# Patient Record
Sex: Female | Born: 1990 | Race: Black or African American | Hispanic: No | Marital: Married | Smoking: Never smoker
Health system: Southern US, Community
[De-identification: ages and names within clinical notes are randomized; demographics above are authoritative.]

## PROBLEM LIST (undated history)

## (undated) DIAGNOSIS — Z789 Other specified health status: Secondary | ICD-10-CM

## (undated) HISTORY — PX: NO PAST SURGERIES: SHX2092

---

## 2019-03-06 ENCOUNTER — Emergency Department (HOSPITAL_COMMUNITY)
Admission: EM | Admit: 2019-03-06 | Discharge: 2019-03-06 | Disposition: A | Discharge status: Home / Self Care | Payer: BLUE CROSS/BLUE SHIELD | Attending: Emergency Medicine | Admitting: Emergency Medicine

## 2019-03-06 ENCOUNTER — Other Ambulatory Visit: Payer: Self-pay

## 2019-03-06 ENCOUNTER — Encounter (HOSPITAL_COMMUNITY): Payer: Self-pay

## 2019-03-06 DIAGNOSIS — I10 Essential (primary) hypertension: Secondary | ICD-10-CM | POA: Diagnosis not present

## 2019-03-06 DIAGNOSIS — R2243 Localized swelling, mass and lump, lower limb, bilateral: Secondary | ICD-10-CM | POA: Insufficient documentation

## 2019-03-06 DIAGNOSIS — M7989 Other specified soft tissue disorders: Secondary | ICD-10-CM

## 2019-03-06 NOTE — ED Provider Notes (Signed)
[facility]-EMERGENCY DEPT Provider Note   CSN: [id number] Arrival date & time: 03/06/19  2005    History   Chief Complaint Chief Complaint  Patient presents with  . Foot Swelling    HPI Paula Cross is a 28 y.o. female.     The history is provided by the patient.  Hypertension  This is a new problem. Episode frequency: unknown, sent from urgent care for high blood pressure, trace swelling in bilaterally feet. The problem has not changed since onset.Pertinent negatives include no chest pain, no abdominal pain, no headaches and no shortness of breath. Nothing aggravates the symptoms. The symptoms are relieved by rest (elevating legs). She has tried nothing for the symptoms. The treatment provided no relief.    History reviewed. No pertinent past medical history.  There are no active problems to display for this patient.   History reviewed. No pertinent surgical history.   OB History   No obstetric history on file.      Home Medications    Prior to Admission medications   Not on File    Family History History reviewed. No pertinent family history.  Social History Social History   Tobacco Use  . Smoking status: Never Smoker  . Smokeless tobacco: Never Used  Substance Use Topics  . Alcohol use: Never    Frequency: Never  . Drug use: Never     Allergies   Patient has no allergy information on record.   Review of Systems Review of Systems  Constitutional: Negative for chills and fever.  HENT: Negative for ear pain and sore throat.   Eyes: Negative for pain and visual disturbance.  Respiratory: Negative for cough and shortness of breath.   Cardiovascular: Positive for leg swelling (bilateral feet over the last several days). Negative for chest pain and palpitations.  Gastrointestinal: Negative for abdominal pain and vomiting.  Genitourinary: Negative for dysuria and hematuria.  Musculoskeletal: Negative for arthralgias and  back pain.  Skin: Negative for color change and rash.  Neurological: Negative for seizures, syncope and headaches.  All other systems reviewed and are negative.    Physical Exam Updated Vital Signs BP (!) 147/99 (BP Location: Left Arm)   Pulse 86   Temp 97.8 F (36.6 C) (Oral)   Resp 16   LMP 02/23/2019   SpO2 100%   Physical Exam Vitals signs and nursing note reviewed.  Constitutional:      General: She is not in acute distress.    Appearance: She is well-developed.  HENT:     Head: Normocephalic and atraumatic.     Nose: Nose normal.     Mouth/Throat:     Mouth: Mucous membranes are moist.  Eyes:     Extraocular Movements: Extraocular movements intact.     Conjunctiva/sclera: Conjunctivae normal.     Pupils: Pupils are equal, round, and reactive to light.  Neck:     Musculoskeletal: Neck supple.  Cardiovascular:     Rate and Rhythm: Normal rate and regular rhythm.     Pulses: Normal pulses.     Heart sounds: Normal heart sounds. No murmur.  Pulmonary:     Effort: Pulmonary effort is normal. No respiratory distress.     Breath sounds: Normal breath sounds.  Abdominal:     Palpations: Abdomen is soft.     Tenderness: There is no abdominal tenderness.  Musculoskeletal:     Right lower leg: Edema (trace in foot) present.     Left lower leg:  Edema (trace in foot) present.  Skin:    General: Skin is warm and dry.  Neurological:     Mental Status: She is alert.      ED Treatments / Results  Labs (all labs ordered are listed, but only abnormal results are displayed) Labs Reviewed - No data to display  EKG None  Radiology No results found.  Procedures Procedures (including critical care time)  Medications Ordered in ED Medications - No data to display   Initial Impression / Assessment and Plan / ED Course  I have reviewed the triage vital signs and the nursing notes.  Pertinent labs & imaging results that were available during my care of the patient  were reviewed by me and considered in my medical decision making (see chart for details).     Paula Cross is a 28 year old female no significant medical history presents the ED with hypertension.  Patient mildly hypertensive to 147/99.  Otherwise normal vitals.  Patient denies any chest pain, shortness of breath.  Patient was sent from urgent care.  She has had some trace swelling in her feet over the last several days intermittently.  This has occurred in the past.  Patient recently had her menstrual cycle.  No concern for pregnancy.  Does not have any symptoms concerning for ACS or stroke.  She has no calf tenderness or swelling above her ankles.  Suspect likely mild venous stasis.  No history of hypertension.  States that she has been having a lot of salty foods recently.  She states that when she wakes up in the morning her feet are not swollen.  She states that she stands a lot for work and notices that the swelling is worse at the end of work.  Overall gave patient reassurance.  Recommend that she keep a journal of her blood pressures over the next several weeks and follow-up with her primary care doctor as she may need to be started on a blood pressure medication.  Recommend that she wear compression stockings at work.  No concern for heart failure or other acute emergency. No concern for DVT.  Given education and discharged from ED in good condition.  Given return precautions.  This chart was dictated using voice recognition software.  Despite best efforts to proofread,  errors can occur which can change the documentation meaning.    Final Clinical Impressions(s) / ED Diagnoses   Final diagnoses:  Foot swelling  Hypertension, unspecified type    ED Discharge Orders    None       Virgina NorfolkCuratolo, Iyanla Eilers, DO 03/06/19 2048

## 2019-03-06 NOTE — ED Triage Notes (Signed)
Pt complains of bilateral feet swelling, she was working outside today in the heat and her shoes were tight, she also states that the [facility] said her blood pressure was elevated and suggested she come here

## 2019-03-06 NOTE — ED Notes (Signed)
Pt was given d/c instructions by provider and was walked out by them prior to me discharging and obtaining a signiture. Pt ambulated out of department in no acute distress.

## 2019-07-19 ENCOUNTER — Encounter (HOSPITAL_COMMUNITY): Payer: Self-pay | Admitting: Emergency Medicine

## 2019-07-19 ENCOUNTER — Other Ambulatory Visit: Payer: Self-pay

## 2019-07-19 ENCOUNTER — Ambulatory Visit (HOSPITAL_COMMUNITY)
Admission: EM | Admit: 2019-07-19 | Discharge: 2019-07-19 | Disposition: A | Discharge status: Home / Self Care | Payer: BC Managed Care – PPO | Attending: Family Medicine | Admitting: Family Medicine

## 2019-07-19 DIAGNOSIS — Z0189 Encounter for other specified special examinations: Secondary | ICD-10-CM | POA: Diagnosis present

## 2019-07-19 DIAGNOSIS — Z3201 Encounter for pregnancy test, result positive: Secondary | ICD-10-CM | POA: Diagnosis present

## 2019-07-19 LAB — HCG, QUANTITATIVE, PREGNANCY: hCG, Beta Chain, Quant, S: 908 m[IU]/mL — ABNORMAL HIGH (ref <5)

## 2019-07-19 NOTE — ED Triage Notes (Signed)
Pt states shes been to her OB for fertility issues a few months ago, stated her progesterone was low, told her to lose 15 pounds, pt lost the weight and took a pregnancy test yesgterday and today, both were positive. Wants confirmation that she is indeed pregnant.

## 2019-07-19 NOTE — ED Provider Notes (Signed)
[place]    CSN: [id number] Arrival date & time: 07/19/19  1114      History   Chief Complaint Chief Complaint  Patient presents with  . Possible Pregnancy    HPI Paula Cross is a 28 y.o. female.   Patient presents with request for pregnancy test.  She has taken 3 at home urine pregnancy test which were all positive.  She request confirmation of this because she has been experiencing infertility and was told that her progesterone level was low.  Patient denies abdominal pain, pelvic pain, back pain, dysuria, vaginal discharge, or other concerns.  LMP: 06/18/2019.    The history is provided by the patient.    History reviewed. No pertinent past medical history.  There are no active problems to display for this patient.   History reviewed. No pertinent surgical history.  OB History   No obstetric history on file.      Home Medications    Prior to Admission medications   Not on File    Family History No family history on file.  Social History Social History   Tobacco Use  . Smoking status: Never Smoker  . Smokeless tobacco: Never Used  Substance Use Topics  . Alcohol use: Never    Frequency: Never  . Drug use: Never     Allergies   Patient has no known allergies.   Review of Systems Review of Systems  Constitutional: Negative for chills and fever.  HENT: Negative for ear pain and sore throat.   Eyes: Negative for pain and visual disturbance.  Respiratory: Negative for cough and shortness of breath.   Cardiovascular: Negative for chest pain and palpitations.  Gastrointestinal: Negative for abdominal pain and vomiting.  Genitourinary: Negative for dysuria, flank pain, hematuria, pelvic pain, vaginal bleeding and vaginal discharge.  Musculoskeletal: Negative for arthralgias and back pain.  Skin: Negative for color change and rash.  Neurological: Negative for seizures and syncope.  All other systems reviewed and are negative.     Physical Exam Triage Vital Signs ED Triage Vitals  Enc Vitals Group     BP      Pulse      Resp      Temp      Temp src      SpO2      Weight      Height      Head Circumference      Peak Flow      Pain Score      Pain Loc      Pain Edu?      Excl. in [place]?    No data found.  Updated Vital Signs BP 127/65   Pulse (!) 110   Temp 97.7 F (36.5 C)   Resp 18   LMP 06/18/2019   SpO2 100%   Visual Acuity Right Eye Distance:   Left Eye Distance:   Bilateral Distance:    Right Eye Near:   Left Eye Near:    Bilateral Near:     Physical Exam Vitals signs and nursing note reviewed.  Constitutional:      General: She is not in acute distress.    Appearance: She is well-developed.  HENT:     Head: Normocephalic and atraumatic.     Mouth/Throat:     Mouth: Mucous membranes are moist.  Eyes:     Conjunctiva/sclera: Conjunctivae normal.  Neck:     Musculoskeletal: Neck supple.  Cardiovascular:  Rate and Rhythm: Normal rate and regular rhythm.     Heart sounds: No murmur.  Pulmonary:     Effort: Pulmonary effort is normal. No respiratory distress.     Breath sounds: Normal breath sounds.  Abdominal:     General: Bowel sounds are normal.     Palpations: Abdomen is soft.     Tenderness: There is no abdominal tenderness. There is no right CVA tenderness, left CVA tenderness, guarding or rebound.  Skin:    General: Skin is warm and dry.     Findings: No rash.  Neurological:     Mental Status: She is alert and oriented to person, place, and time.  Psychiatric:        Mood and Affect: Mood normal.        Behavior: Behavior normal.      UC Treatments / Results  Labs (all labs ordered are listed, but only abnormal results are displayed) Labs Reviewed  HCG, QUANTITATIVE, PREGNANCY - Abnormal; Notable for the following components:      Result Value   hCG, Beta Chain, Quant, S 908 (*)    All other components within normal limits    EKG   Radiology No  results found.  Procedures Procedures (including critical care time)  Medications Ordered in UC Medications - No data to display  Initial Impression / Assessment and Plan / UC Course  I have reviewed the triage vital signs and the nursing notes.  Pertinent labs & imaging results that were available during my care of the patient were reviewed by me and considered in my medical decision making (see chart for details).    Patient request for pregnancy test.  Quantitative hCG positive.   Instructed patient to start taking prenatal vitamins today, increase her water intake, and follow-up with her OB/GYN next week.  Patient agrees to plan of care.     Final Clinical Impressions(s) / UC Diagnoses   Final diagnoses:  Patient request for diagnostic testing  Positive pregnancy test     Discharge Instructions     Start taking pre-natal vitamins today.    Make sure you are staying hydrated with 10-12 glasses of water daily.  Follow up with your OB/GYN next week.        ED Prescriptions    None     PDMP not reviewed this encounter.   Mickie Bail, NP 07/19/19 1430

## 2019-07-19 NOTE — Discharge Instructions (Addendum)
Start taking pre-natal vitamins today.    Make sure you are staying hydrated with 10-12 glasses of water daily.  Follow up with your OB/GYN next week.

## 2019-07-28 ENCOUNTER — Other Ambulatory Visit: Payer: Self-pay | Admitting: Obstetrics

## 2019-08-20 ENCOUNTER — Encounter (HOSPITAL_COMMUNITY): Payer: Self-pay

## 2019-08-20 ENCOUNTER — Other Ambulatory Visit: Payer: Self-pay

## 2019-08-20 ENCOUNTER — Emergency Department (HOSPITAL_COMMUNITY)
Admission: EM | Admit: 2019-08-20 | Discharge: 2019-08-21 | Disposition: A | Discharge status: Home / Self Care | Payer: BC Managed Care – PPO | Attending: Emergency Medicine | Admitting: Emergency Medicine

## 2019-08-20 DIAGNOSIS — Z79899 Other long term (current) drug therapy: Secondary | ICD-10-CM | POA: Diagnosis not present

## 2019-08-20 DIAGNOSIS — O418X12 Other specified disorders of amniotic fluid and membranes, first trimester, fetus 2: Secondary | ICD-10-CM | POA: Insufficient documentation

## 2019-08-20 DIAGNOSIS — O209 Hemorrhage in early pregnancy, unspecified: Secondary | ICD-10-CM | POA: Diagnosis present

## 2019-08-20 DIAGNOSIS — O468X1 Other antepartum hemorrhage, first trimester: Secondary | ICD-10-CM

## 2019-08-20 DIAGNOSIS — Z3A09 9 weeks gestation of pregnancy: Secondary | ICD-10-CM | POA: Insufficient documentation

## 2019-08-20 DIAGNOSIS — O418X10 Other specified disorders of amniotic fluid and membranes, first trimester, not applicable or unspecified: Secondary | ICD-10-CM

## 2019-08-20 LAB — CBC WITH DIFFERENTIAL/PLATELET
Abs Immature Granulocytes: 0.03 10*3/uL (ref 0.00–0.07)
Basophils Absolute: 0 10*3/uL (ref 0.0–0.1)
Basophils Relative: 0 %
Eosinophils Absolute: 0.1 10*3/uL (ref 0.0–0.5)
Eosinophils Relative: 1 %
HCT: 40.9 % (ref 36.0–46.0)
Hemoglobin: 13.2 g/dL (ref 12.0–15.0)
Immature Granulocytes: 0 %
Lymphocytes Relative: 16 %
Lymphs Abs: 1.7 10*3/uL (ref 0.7–4.0)
MCH: 27.6 pg (ref 26.0–34.0)
MCHC: 32.3 g/dL (ref 30.0–36.0)
MCV: 85.4 fL (ref 80.0–100.0)
Monocytes Absolute: 0.9 10*3/uL (ref 0.1–1.0)
Monocytes Relative: 9 %
Neutro Abs: 8 10*3/uL — ABNORMAL HIGH (ref 1.7–7.7)
Neutrophils Relative %: 74 %
Platelets: 278 10*3/uL (ref 150–400)
RBC: 4.79 MIL/uL (ref 3.87–5.11)
RDW: 15.4 % (ref 11.5–15.5)
WBC: 10.8 10*3/uL — ABNORMAL HIGH (ref 4.0–10.5)
nRBC: 0 % (ref 0.0–0.2)

## 2019-08-20 LAB — BASIC METABOLIC PANEL
Anion gap: 10 (ref 5–15)
BUN: 12 mg/dL (ref 6–20)
CO2: 19 mmol/L — ABNORMAL LOW (ref 22–32)
Calcium: 9.2 mg/dL (ref 8.9–10.3)
Chloride: 108 mmol/L (ref 98–111)
Creatinine, Ser: 0.85 mg/dL (ref 0.44–1.00)
GFR calc Af Amer: >60 mL/min (ref >60)
GFR calc non Af Amer: >60 mL/min (ref >60)
Glucose, Bld: 95 mg/dL (ref 70–99)
Potassium: 3.8 mmol/L (ref 3.5–5.1)
Sodium: 137 mmol/L (ref 135–145)

## 2019-08-20 LAB — WET PREP, GENITAL
Clue Cells Wet Prep HPF POC: NONE SEEN
Sperm: NONE SEEN
Trich, Wet Prep: NONE SEEN
Yeast Wet Prep HPF POC: NONE SEEN

## 2019-08-20 LAB — HCG, QUANTITATIVE, PREGNANCY: hCG, Beta Chain, Quant, S: 167422 m[IU]/mL — ABNORMAL HIGH (ref <5)

## 2019-08-20 LAB — ABO/RH: ABO/RH(D): B POS

## 2019-08-20 NOTE — ED Notes (Signed)
Patient stated that this is her first time being pregnant, she had two episodes of light pink discharge today, the bleeding was so faint that she only saw it when she wiped. She had an episode of yellow discharge earlier this week and brown discharge last week, which she called her OB about. She states this is her first pregnancy and she is being very cautious.

## 2019-08-20 NOTE — ED Triage Notes (Signed)
Pt coming from home c/o vaginal bleeding that started 20 minutes ago with moderate amount of blood after wiping. Pt is [redacted] weeks pregnant with twins. Last sexual intercourse was 2 weeks ago. No cramping.

## 2019-08-20 NOTE — ED Provider Notes (Signed)
[facility]-EMERGENCY DEPT Provider Note   CSN: [id number] Arrival date & time: 08/20/19  2137     History   Chief Complaint Chief Complaint  Patient presents with   Vaginal Bleeding    pregnant     HPI Paula Cross is a 28 y.o. female.     28 year old female presents today with her husband for complaint of vaginal bleeding and pregnancy.  Patient states that she is [redacted] weeks pregnant with twins, confirmed on ultrasound at her OB office, Dr. Algie CofferFogelman.  Patient states she went to the bathroom tonight and noticed a small amount of light pink blood on the toilet paper, when a second time and noticed blood mixed with her yellow vaginal discharge.  Patient denies any lower pelvic pain, cramping, nausea, vomiting, changes in bowel or bladder habits. G1P0, no other complaints or concerns.      History reviewed. No pertinent past medical history.  There are no active problems to display for this patient.   History reviewed. No pertinent surgical history.   OB History    Gravida  1   Para      Term      Preterm      AB      Living        SAB      TAB      Ectopic      Multiple      Live Births               Home Medications    Prior to Admission medications   Medication Sig Start Date End Date Taking? Authorizing Provider  Prenatal Vit-Fe Fumarate-FA (PRENATAL MULTIVITAMIN) TABS tablet Take 1 tablet by mouth daily at 12 noon.   Yes [provider]    Family History No family history on file.  Social History Social History   Tobacco Use   Smoking status: Never Smoker   Smokeless tobacco: Never Used  Substance Use Topics   Alcohol use: Never    Frequency: Never   Drug use: Never     Allergies   Patient has no known allergies.   Review of Systems Review of Systems  Constitutional: Negative for fever.  Respiratory: Negative for shortness of breath.   Cardiovascular: Negative for chest pain.    Gastrointestinal: Negative for abdominal pain, constipation, diarrhea, nausea and vomiting.  Genitourinary: Positive for vaginal bleeding and vaginal discharge. Negative for dysuria and frequency.  Musculoskeletal: Negative for back pain.  Skin: Negative for rash and wound.  Allergic/Immunologic: Negative for immunocompromised state.  Neurological: Negative for dizziness and weakness.  Hematological: Does not bruise/bleed easily.  Psychiatric/Behavioral: Negative for confusion.  All other systems reviewed and are negative.    Physical Exam Updated Vital Signs BP (!) 125/91 (BP Location: Left Arm)    Pulse 78    Temp 98.1 F (36.7 C) (Oral)    Resp 15    Ht 5\' 11"  (1.803 m)    LMP 06/18/2019    SpO2 99%   Physical Exam Vitals signs and nursing note reviewed. Exam conducted with a chaperone present.  Constitutional:      General: She is not in acute distress.    Appearance: She is well-developed. She is not diaphoretic.  HENT:     Head: Normocephalic and atraumatic.  Cardiovascular:     Rate and Rhythm: Normal rate and regular rhythm.     Pulses: Normal pulses.     Heart sounds: Normal heart  sounds.  Pulmonary:     Effort: Pulmonary effort is normal.     Breath sounds: Normal breath sounds.  Abdominal:     General: There is no distension.     Palpations: Abdomen is soft.     Tenderness: There is no abdominal tenderness.  Genitourinary:    Cervix: No cervical motion tenderness.     Uterus: Not tender.      Adnexa:        Right: No tenderness.         Left: No tenderness.       Comments: Exam limited by BMP, no pain on exam. Small amount of brown discharge present.  Skin:    General: Skin is warm and dry.     Findings: No erythema or rash.  Neurological:     Mental Status: She is alert and oriented to person, place, and time.  Psychiatric:        Behavior: Behavior normal.      ED Treatments / Results  Labs (all labs ordered are listed, but only abnormal results  are displayed) Labs Reviewed  WET PREP, GENITAL - Abnormal; Notable for the following components:      Result Value   WBC, Wet Prep HPF POC FEW (*)    All other components within normal limits  HCG, QUANTITATIVE, PREGNANCY - Abnormal; Notable for the following components:   hCG, Beta Neomia Dear 517,616 (*)    All other components within normal limits  BASIC METABOLIC PANEL - Abnormal; Notable for the following components:   CO2 19 (*)    All other components within normal limits  CBC WITH DIFFERENTIAL/PLATELET - Abnormal; Notable for the following components:   WBC 10.8 (*)    Neutro Abs 8.0 (*)    All other components within normal limits  HIV ANTIBODY (ROUTINE TESTING W REFLEX)  ABO/RH  GC/CHLAMYDIA PROBE AMP ([place]) NOT AT [facility]    EKG None  Radiology US Ob Comp < 14 Wks  Result Date: 08/21/2019 CLINICAL DATA:  Vaginal bleeding with positive pregnancy test EXAM: TWIN OBSTETRIC <14WK [place] AND TRANSVAGINAL OB US COMPARISON:  None. FINDINGS: Number of IUPs:  2 TWIN 1 Yolk sac:  Present Embryo:  Present Cardiac Activity: Present Heart Rate: 156 bpm CRL:  22.9 mm   9 w 0 d TWIN 2 Yolk sac:  Present Embryo:  Present Cardiac Activity: Present Heart Rate: 149 bpm CRL:  21 mm   8 w 5 d Subchorionic hemorrhage:  Small subchorionic hemorrhage is noted. Maternal uterus/adnexae: The ovaries are not well visualized. Mild fluid is noted within the cervical canal. IMPRESSION: Twin live intrauterine gestations. Small subchorionic hemorrhage is noted. No other focal abnormality is seen. Electronically Signed   By: Inez Catalina M.D.   On: 08/21/2019 01:11   US Ob Transvaginal  Result Date: 08/21/2019 CLINICAL DATA:  Vaginal bleeding with positive pregnancy test EXAM: TWIN OBSTETRIC <14WK [place] AND TRANSVAGINAL OB US COMPARISON:  None. FINDINGS: Number of IUPs:  2 TWIN 1 Yolk sac:  Present Embryo:  Present Cardiac Activity: Present Heart Rate: 156 bpm CRL:  22.9 mm   9 w 0 d TWIN 2 Yolk sac:   Present Embryo:  Present Cardiac Activity: Present Heart Rate: 149 bpm CRL:  21 mm   8 w 5 d Subchorionic hemorrhage:  Small subchorionic hemorrhage is noted. Maternal uterus/adnexae: The ovaries are not well visualized. Mild fluid is noted within the cervical canal. IMPRESSION: Twin live intrauterine gestations. Small subchorionic  hemorrhage is noted. No other focal abnormality is seen. Electronically Signed   By: Alcide Clever M.D.   On: 08/21/2019 01:11    Procedures Procedures (including critical care time)  Medications Ordered in ED Medications - No data to display   Initial Impression / Assessment and Plan / ED Course  I have reviewed the triage vital signs and the nursing notes.  Pertinent labs & imaging results that were available during my care of the patient were reviewed by me and considered in my medical decision making (see chart for details).  Clinical Course as of Aug 20 257  Thu Aug 21, 2019  7268 28 year old female presents with complaint of vaginal bleeding, states she is [redacted] weeks pregnant as confirmed by ultrasound at her OB office, had noticed blood with wiping today.  Patient is not having to wear tampons or pads, denies any pelvic cramping or pain.  On exam patient has a small amount of brown discharge present, no pelvic tenderness.  Ultrasound shows a small subchorionic hemorrhage, viable twin pregnancy.  CBC, BMP without significant findings, hCG consistent with pregnancy, wet prep negative, patient is B+ and does not require RhoGam.  Patient to follow-up with her OB, return to the ER or go to [facility] if symptoms are concerning.   [LM]    Clinical Course User Index [LM] Jeannie Fend, PA-C      Final Clinical Impressions(s) / ED Diagnoses   Final diagnoses:  Subchorionic hematoma in first trimester, single or unspecified fetus    ED Discharge Orders    None       Jeannie Fend, PA-C 08/21/19 0259    Mancel Bale, MD 08/21/19 1150

## 2019-08-21 ENCOUNTER — Emergency Department (HOSPITAL_COMMUNITY): Payer: BC Managed Care – PPO

## 2019-08-21 LAB — HIV ANTIBODY (ROUTINE TESTING W REFLEX): HIV Screen 4th Generation wRfx: NONREACTIVE

## 2019-08-21 NOTE — Discharge Instructions (Addendum)
Follow-up with your OB.  Return to the ER or present to [facility] women's unit for any worsening or concerning symptoms.

## 2019-08-22 LAB — GC/CHLAMYDIA PROBE AMP (~~LOC~~) NOT AT ARMC
Chlamydia: NEGATIVE
Neisseria Gonorrhea: NEGATIVE

## 2019-08-28 LAB — OB RESULTS CONSOLE RPR: RPR: NONREACTIVE

## 2019-08-28 LAB — OB RESULTS CONSOLE RUBELLA ANTIBODY, IGM: Rubella: IMMUNE

## 2019-08-28 LAB — OB RESULTS CONSOLE HEPATITIS B SURFACE ANTIGEN: Hepatitis B Surface Ag: NEGATIVE

## 2019-11-26 ENCOUNTER — Inpatient Hospital Stay (HOSPITAL_COMMUNITY)
Admission: AD | Admit: 2019-11-26 | Discharge: 2019-12-31 | DRG: 833 | Disposition: A | Discharge status: Home / Self Care | Payer: BC Managed Care – PPO | Attending: Obstetrics | Admitting: Obstetrics

## 2019-11-26 DIAGNOSIS — Z3A25 25 weeks gestation of pregnancy: Secondary | ICD-10-CM | POA: Diagnosis not present

## 2019-11-26 DIAGNOSIS — O3432 Maternal care for cervical incompetence, second trimester: Secondary | ICD-10-CM | POA: Diagnosis present

## 2019-11-26 DIAGNOSIS — Z20822 Contact with and (suspected) exposure to covid-19: Secondary | ICD-10-CM | POA: Diagnosis present

## 2019-11-26 DIAGNOSIS — O3430 Maternal care for cervical incompetence, unspecified trimester: Secondary | ICD-10-CM

## 2019-11-26 DIAGNOSIS — O30042 Twin pregnancy, dichorionic/diamniotic, second trimester: Secondary | ICD-10-CM | POA: Diagnosis present

## 2019-11-26 DIAGNOSIS — Z362 Encounter for other antenatal screening follow-up: Secondary | ICD-10-CM | POA: Diagnosis not present

## 2019-11-26 DIAGNOSIS — Z3A23 23 weeks gestation of pregnancy: Secondary | ICD-10-CM | POA: Diagnosis not present

## 2019-11-26 DIAGNOSIS — O99212 Obesity complicating pregnancy, second trimester: Secondary | ICD-10-CM

## 2019-11-26 DIAGNOSIS — Z3A26 26 weeks gestation of pregnancy: Secondary | ICD-10-CM

## 2019-11-26 DIAGNOSIS — E669 Obesity, unspecified: Secondary | ICD-10-CM | POA: Diagnosis present

## 2019-11-26 DIAGNOSIS — Z3A24 24 weeks gestation of pregnancy: Secondary | ICD-10-CM | POA: Diagnosis not present

## 2019-11-26 DIAGNOSIS — O3442 Maternal care for other abnormalities of cervix, second trimester: Secondary | ICD-10-CM | POA: Diagnosis not present

## 2019-11-26 DIAGNOSIS — O30043 Twin pregnancy, dichorionic/diamniotic, third trimester: Secondary | ICD-10-CM | POA: Diagnosis not present

## 2019-11-26 DIAGNOSIS — O99213 Obesity complicating pregnancy, third trimester: Secondary | ICD-10-CM | POA: Diagnosis not present

## 2019-11-26 DIAGNOSIS — Z364 Encounter for antenatal screening for fetal growth retardation: Secondary | ICD-10-CM | POA: Diagnosis not present

## 2019-11-26 DIAGNOSIS — N883 Incompetence of cervix uteri: Secondary | ICD-10-CM

## 2019-11-26 LAB — CBC WITH DIFFERENTIAL/PLATELET
Abs Immature Granulocytes: 0.05 10*3/uL (ref 0.00–0.07)
Basophils Absolute: 0 10*3/uL (ref 0.0–0.1)
Basophils Relative: 0 %
Eosinophils Absolute: 0.1 10*3/uL (ref 0.0–0.5)
Eosinophils Relative: 1 %
HCT: 35.7 % — ABNORMAL LOW (ref 36.0–46.0)
Hemoglobin: 11.5 g/dL — ABNORMAL LOW (ref 12.0–15.0)
Immature Granulocytes: 1 %
Lymphocytes Relative: 9 %
Lymphs Abs: 0.9 10*3/uL (ref 0.7–4.0)
MCH: 26.3 pg (ref 26.0–34.0)
MCHC: 32.2 g/dL (ref 30.0–36.0)
MCV: 81.7 fL (ref 80.0–100.0)
Monocytes Absolute: 0.6 10*3/uL (ref 0.1–1.0)
Monocytes Relative: 6 %
Neutro Abs: 8.2 10*3/uL — ABNORMAL HIGH (ref 1.7–7.7)
Neutrophils Relative %: 83 %
Platelets: 285 10*3/uL (ref 150–400)
RBC: 4.37 MIL/uL (ref 3.87–5.11)
RDW: 15.8 % — ABNORMAL HIGH (ref 11.5–15.5)
WBC: 9.8 10*3/uL (ref 4.0–10.5)
nRBC: 0 % (ref 0.0–0.2)

## 2019-11-26 LAB — ABO/RH: ABO/RH(D): B POS

## 2019-11-26 LAB — TYPE AND SCREEN
ABO/RH(D): B POS
Antibody Screen: NEGATIVE

## 2019-11-26 LAB — SARS CORONAVIRUS 2 (TAT 6-24 HRS): SARS Coronavirus 2: NEGATIVE

## 2019-11-26 MED ORDER — BETAMETHASONE SOD PHOS & ACET 6 (3-3) MG/ML IJ SUSP
12.0000 mg | INTRAMUSCULAR | Status: AC
  Administered 2019-11-26 – 2019-11-27 (×2): 12 mg via INTRAMUSCULAR
  Filled 2019-11-26: qty 5

## 2019-11-26 MED ORDER — PRENATAL MULTIVITAMIN CH
1.0000 | ORAL_TABLET | Freq: Every day | ORAL | Status: DC
  Administered 2019-11-27 – 2019-12-31 (×33): 1 via ORAL
  Filled 2019-11-26 (×35): qty 1

## 2019-11-26 MED ORDER — ACETAMINOPHEN 325 MG PO TABS
650.0000 mg | ORAL_TABLET | ORAL | Status: DC | PRN
  Administered 2019-12-04 – 2019-12-07 (×3): 650 mg via ORAL
  Administered 2019-12-26: 325 mg via ORAL
  Administered 2019-12-28: 650 mg via ORAL
  Filled 2019-11-26 (×5): qty 2

## 2019-11-26 MED ORDER — PROGESTERONE MICRONIZED 200 MG PO CAPS
200.0000 mg | ORAL_CAPSULE | Freq: Every day | ORAL | Status: DC
  Administered 2019-11-26 – 2019-12-30 (×35): 200 mg via VAGINAL
  Filled 2019-11-26 (×35): qty 1

## 2019-11-26 MED ORDER — FAMOTIDINE 20 MG PO TABS
40.0000 mg | ORAL_TABLET | Freq: Every day | ORAL | Status: DC
  Administered 2019-11-28 – 2019-12-20 (×23): 40 mg via ORAL
  Filled 2019-11-26 (×26): qty 2

## 2019-11-26 MED ORDER — DOCUSATE SODIUM 100 MG PO CAPS
100.0000 mg | ORAL_CAPSULE | Freq: Every day | ORAL | Status: DC
  Administered 2019-11-27 – 2019-11-29 (×3): 100 mg via ORAL
  Filled 2019-11-26 (×3): qty 1

## 2019-11-26 MED ORDER — ZOLPIDEM TARTRATE 5 MG PO TABS
5.0000 mg | ORAL_TABLET | Freq: Every evening | ORAL | Status: DC | PRN
  Filled 2019-11-26: qty 1

## 2019-11-26 MED ORDER — CALCIUM CARBONATE ANTACID 500 MG PO CHEW
2.0000 | CHEWABLE_TABLET | ORAL | Status: DC | PRN
  Administered 2019-11-30 – 2019-12-25 (×5): 400 mg via ORAL
  Filled 2019-11-26 (×5): qty 2

## 2019-11-26 NOTE — Consult Note (Signed)
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    Ms.Casciano is a 29 y/o Primigravida who was admitted today and is now [redacted] weeks GA spontaneous di-di twins.   She is currently "not" having active labor but admitted for betamethasone, maternal fetal evaluation and strict bedrest.   I spoke with Ms.Balcom and FOB in Room 117.   We discussed in detail what to expect in case of possible delivery of twin infants in the next few days including increased morbidity and mortality at this gestational age, usual delivery room resuscitation including intubation and surfactant administration in the DR.  Made them aware that risks is higher in twins compared to singleton.  Discussed possible respiratory complications and need for support including mechanical ventilation, IV access, sepsis work-up, NG/OG feedings ( benefits of EBM which MOB desires and was encouraged), risk for IVH with the potential for motor/cognitive deficits, length of stay and long-term outcome.  They were attentive, had appropriate questions, and expressed appreciation for my input.  Ms. Friese was mainly concerned about staying in the hospital secondary to risk of COVID-19 exposure compared to just staying home.  Thank you for asking [place] to see this patient and allowing [place] to participate in her care.  Please call if there are any further questions.   Overton Mam, MD (Attending Neonatologist)  Total length of face-to-face or floor/unit time for this encounter was 40 minutes. Counseling and/or coordination of care was greater than fifty percent of the time above.

## 2019-11-26 NOTE — H&P (Addendum)
Chief complaint: Cervical incompetence  History of present illness: 29 year old G1 with spontaneous di-ditwins recently found to show cervical incompetence and now being admitted for betamethasone and a maternal fetal evaluation and strict bedrest.  Prenatal issues -Cervical incompetence.  Patient with no risk factors for cervical incompetence, no prior LEEP or cervical procedures.  Patient had normal cervical length at routine 19-week anatomy scan.  Patient return for follow-up anatomical images at 21 weeks and V shaped funneling was noted.  1.7 cm of closed cervical length was noted at that time, with 1.4 cm in length by 1.0 cm in width of the funnel.  Patient was started on vaginal Prometrium and home bedrest.  Follow-up ultrasound 4 days later showed stability of her cervical length and patient was continued on vaginal Prometrium and home bedrest.  1 week follow-up occurred today in the new worsening cervical incompetence was noted. -History of infertility.  This was a spontaneous pregnancy while awaiting start of fertility treatment. -Obesity.  Early diabetic screen 124 -Declined genetic screening, normal quad.   Prenatal Transfer Tool  Maternal Diabetes: No Genetic Screening: Normal Maternal Ultrasounds/Referrals: Normal Fetal Ultrasounds or other Referrals:  None Maternal Substance Abuse:  No Significant Maternal Medications:  None Significant Maternal Lab Results: None     No past medical history on file.  No past surgical history on file.   Allergies: No known drug allergies  Medications: Prenatal vitamin, vaginal Prometrium 200 mg at nightly  Physical exam: Vitals:   11/26/19 1047  BP: 124/84  Pulse: (!) 115  Resp: 18  Temp: 98.8 F (37.1 C)  SpO2: 99%   General: Appropriately upset and intermittently tearful, no distress Cardiovascular: Regular rate and rhythm Pulmonary: Clear to auscultation bilaterally Abdomen: Soft, gravid, no fundal tenderness, no right  upper quadrant pain, no costovertebral angle tenderness GU: Cervix visually closed and appears long on speculum exam, digital exam not done, no evidence rupture of membranes, no abnormal discharge or fluid in the vagina, no vaginal bleeding Lower extremity: Nontender, no edema  Office ultrasound 217: A: Vertex, anterior placenta, anatomy has been completed.  B: Transverse, backup, posterior placenta have not had good visualization of four-chamber heart.  Adequate AFI.  Cervix 0.6 cm close cervical length.  Additional 2 cm cervical length with U-shaped funnel with a fundal width of 0.76 cm  Assessment and plan: 29 year old G1 at 23 weeks and 0 days with di-ditwins and cervical incompetence.  Patient has been managed for the past 1.5 weeks with home bedrest and vaginal Prometrium.  Patient does not have any evidence of labor or rupture of membranes however despite home bedrest patient has continued to show worsening cervical incompetence.  At this time patient will be admitted to the hospital and will likely be here into the third trimester.  We will continue vaginal Prometrium, 200 mg at nightly.  If patient begins to have uterine irritability or contractions will consider Procardia.  Betamethasone now and repeat in 24 hours.  Can consider repeat dose if patient still pregnant in 4 weeks time.  Dr. Annamaria Boots from MFM has consulted on this patient and will be in to see the patient in 2 days time.  Discussion was had regarding rescue cerclage but given the lack of supporting evidence and actual evidence showing higher risks in twin pregnancies we will not proceed with cervical cerclage.  NICU consult pending.  Prophylactic measures.  Pepcid, SCDs, Colace, strict bedrest.  Daily and as needed fetal monitoring.  Ala Dach 11/26/2019 3:18 PM  Late addendum to note regarding fetal monitoring at time of admission Toco: No contractions NST A: 150s, 10 x 10 accelerations, appropriate for gestational age,  5-8 beat variability, no decelerations NST B: 150s, 10 x 10 accelerations, appropriate for gestational age, 5-8 beat variability, no decelerations  Lendon Colonel 11/27/2019 12:01 PM

## 2019-11-27 ENCOUNTER — Encounter (HOSPITAL_COMMUNITY): Payer: Self-pay | Admitting: Obstetrics

## 2019-11-27 ENCOUNTER — Other Ambulatory Visit: Payer: Self-pay

## 2019-11-27 NOTE — Progress Notes (Signed)
Hospital day #2, cervical incompetence 23 weeks and 1 day Di-ditwins  Subjective: Patient notes feeling better and slightly more optimistic today than yesterday.  She appreciated the NICU consult yesterday.  Patient still frustrated by lack of definite plan.  Patient understands goal is to continue bedrest and vaginal Prometrium in order to prolong gestation.  Patient notes good fetal movement x2.  No leakage of fluid though more discharge after vaginal Prometrium.  No vaginal bleeding.  Patient is able to void on a bedpan.  Patient is tolerating bedrest at this time.  She has not yet had a bowel movement.  She reports no chest pain or shortness of breath.  She reports no lower extremity swelling and has been wearing her SCDs.  Objective: Vitals:   11/26/19 2316 11/27/19 0548 11/27/19 0630 11/27/19 0929  BP: 114/60  (!) 112/54 (!) 101/47  Pulse: (!) 103  90 (!) 111  Resp: 18  18 18   Temp: 98.4 F (36.9 C)  98.6 F (37 C) 98.3 F (36.8 C)  TempSrc: Oral  Oral Oral  SpO2: 99%  98% 98%  Weight:  (!) 139.9 kg     General: Well-appearing, no distress Abdomen: Obese, gravid, no fundal tenderness, no right upper quadrant pain GU: Deferred Lower extremity: Nontender, no edema  Toco: None NST: A: 150s, +10 x 10 accelerations, no decelerations, 5-8 beat variability, difficult to get prolonged tracing due to early gestational age and maternal obesity however fetal movement noticed by patient and heard during 1 hour of monitoring NST: B: 150s, +10 x 10 accelerations, no decelerations, 5-8 beat variability, difficult to get prolonged tracing due to early gestational age and maternal obesity however fetal movement noticed by patient and heard during 1 hour of monitoring CBC    Component Value Date/Time   WBC 9.8 11/26/2019 1215   RBC 4.37 11/26/2019 1215   HGB 11.5 (L) 11/26/2019 1215   HCT 35.7 (L) 11/26/2019 1215   PLT 285 11/26/2019 1215   MCV 81.7 11/26/2019 1215   MCH 26.3 11/26/2019 1215    MCHC 32.2 11/26/2019 1215   RDW 15.8 (H) 11/26/2019 1215   LYMPHSABS 0.9 11/26/2019 1215   MONOABS 0.6 11/26/2019 1215   EOSABS 0.1 11/26/2019 1215   BASOSABS 0.0 11/26/2019 1215    Assessment plan: 29 year old G1 at 55 and 1 with progressive cervical incompetence despite home bedrest and vaginal Prometrium.  Patient admitted for strict bedrest and closer evaluation.  Betamethasone No. 2 planned later today.  MFM has consulted regarding patient's care with provider and plans to see patient tomorrow morning.  Patient understands plan for strict bedrest.  Continue prophylactic measures such as stool softeners, antireflux medications, SCDs.  Fetal testing appropriate for gestational age  30 11/27/2019 12:01 PM

## 2019-11-28 DIAGNOSIS — O3432 Maternal care for cervical incompetence, second trimester: Principal | ICD-10-CM

## 2019-11-28 DIAGNOSIS — O3442 Maternal care for other abnormalities of cervix, second trimester: Secondary | ICD-10-CM

## 2019-11-28 DIAGNOSIS — O99212 Obesity complicating pregnancy, second trimester: Secondary | ICD-10-CM

## 2019-11-28 DIAGNOSIS — Z3A23 23 weeks gestation of pregnancy: Secondary | ICD-10-CM

## 2019-11-28 DIAGNOSIS — O30042 Twin pregnancy, dichorionic/diamniotic, second trimester: Secondary | ICD-10-CM

## 2019-11-28 LAB — CULTURE, BETA STREP (GROUP B ONLY)

## 2019-11-28 MED ORDER — POLYETHYLENE GLYCOL 3350 17 G PO PACK
17.0000 g | PACK | Freq: Every day | ORAL | Status: DC | PRN
  Administered 2019-11-28 – 2019-12-18 (×2): 17 g via ORAL
  Filled 2019-11-28 (×2): qty 1

## 2019-11-28 MED ORDER — ENOXAPARIN SODIUM 40 MG/0.4ML ~~LOC~~ SOLN: 40.0000 mg | Freq: Every day | SUBCUTANEOUS | Status: DC

## 2019-11-28 MED ORDER — ENOXAPARIN SODIUM 80 MG/0.8ML ~~LOC~~ SOLN
70.0000 mg | Freq: Every day | SUBCUTANEOUS | Status: DC
  Administered 2019-11-29 – 2019-12-31 (×33): 70 mg via SUBCUTANEOUS
  Filled 2019-11-28 (×33): qty 0.8

## 2019-11-28 NOTE — Progress Notes (Signed)
I introduced spiritual care services to pt.  She reported that she is feeling much more comfortable and feels that there are preventative steps being taken after talking with the MFM physician this morning.  She has good support from her family, her SO's family and a few close friends.  We talked about how to ask for the support that you need without inviting other people's anxiety.  She is aware of our ongoing availability for support.  Chaplain Dyanne Carrel, [facility] Pager, [phone] 2:55 PM

## 2019-11-28 NOTE — Progress Notes (Signed)
Hospital day #3 Preterm cervical change 23w 3d DIDI twins  Subjective: Patient notes good fetal movement x2.  No leakage of fluid though more discharge after vaginal Prometrium.  No vaginal bleeding.  Patient is able to void on a bedpan.  Patient is tolerating bedrest at this time.  She has not yet had a bowel movement.  She reports no chest pain or shortness of breath.  She reports no lower extremity swelling and has been wearing her SCDs. No change in DC or contractions noted.  Objective: Vitals:   11/27/19 1952 11/27/19 2226 11/27/19 2229 11/28/19 0633  BP: 120/64 97/63 (!) 107/51 114/60  Pulse: (!) 104 (!) 103 93 97  Resp: 17  18 18   Temp: 98.2 F (36.8 C)  98.3 F (36.8 C) 98.4 F (36.9 C)  TempSrc: Oral  Oral Oral  SpO2: 100%  100% 99%  Weight:    (!) 139.4 kg   General: Well-appearing, no distress NCAT Neck : supple with FROM Lungs: CTA CV: RRR Abdomen: Obese, gravid, no fundal tenderness, no right upper quadrant pain No CVAT Skin: intact Neuro: nonfocal GU: Deferred Lower extremity: Nontender, no edema  Toco: None NST: A: 150s, +10 x 10 accelerations, no decelerations, 5-8 beat variability, difficult to get prolonged tracing due to early gestational age and maternal obesity however fetal movement noticed by patient and heard during 1 hour of monitoring NST: B: 150s, +10 x 10 accelerations, no decelerations, 5-8 beat variability, difficult to get prolonged tracing due to early gestational age and maternal obesity however fetal movement noticed by patient and heard during 1 hour of monitoring  Reassuring FHR tracings x 3  CBC    Component Value Date/Time   WBC 9.8 11/26/2019 1215   RBC 4.37 11/26/2019 1215   HGB 11.5 (L) 11/26/2019 1215   HCT 35.7 (L) 11/26/2019 1215   PLT 285 11/26/2019 1215   MCV 81.7 11/26/2019 1215   MCH 26.3 11/26/2019 1215   MCHC 32.2 11/26/2019 1215   RDW 15.8 (H) 11/26/2019 1215   LYMPHSABS 0.9 11/26/2019 1215   MONOABS 0.6 11/26/2019  1215   EOSABS 0.1 11/26/2019 1215   BASOSABS 0.0 11/26/2019 1215    Assessment plan:   23 w 3d DIDI twin IUP Preterm cervical change- no risk factors Reassuring fetal status MFM note pending BMZ series complete Vaginal prometrium   Neev Mcmains J 11/28/2019 8:55 AM

## 2019-11-28 NOTE — Consult Note (Signed)
MFM Consult  This patient was seen in consultation at the request of Dr. Valentino Saxon due to a dichorionic, diamniotic twin gestation with a sonographically detected shortened cervix.  The patient is a gravida 1 para 0 currently at 23 weeks and 3 days.  The patient had a normal cervical length during her 19-week fetal anatomy scan.  At 21 weeks, her cervical length was 1.7 cm long with funneling.  At that time, I advised Dr. Valentino Saxon to start the patient on daily vaginal progesterone as it would most likely not cause any harm and may decrease her risk of a preterm birth.  At 23 weeks, her cervical length was 0.6 cm long with a significant amount of funneling.  Her cervix appeared closed on a speculum exam 2 days ago.  The patient denies any prior surgeries to her cervix.  This is a spontaneously conceived dichorionic twin gestation.  The patient reports that she is having a boy and a girl.  She had a quad screen which indicated a low risk for Down syndrome and trisomy 51.  Other than obesity, she denies any significant past medical history and denies any past surgical history.  Due to her sonographically detected shortened cervix with significant funneling, the patient was admitted to the hospital and placed on modified bedrest 2 days ago.  She denies feeling any lower abdominal cramping or contractions.  She reports feeling fetal movements x2.  She has already received a complete course of antenatal corticosteroids.  The patient was advised that the cause of her sonographically detected shortened cervix remains undetermined, although twin gestations are at increased risk for a preterm birth. She was advised that due to the sonographically detected shortened cervix with funneling, she is at increased risk for a preterm birth.  However, she was also advised that many women with a sonographically detected shortened cervix may go on to have a late preterm or even a full-term birth.  Due to the twin gestation and as  she does not have a prior preterm birth, a cervical cerclage is not recommended.    Due to a twin gestation with a shortened cervix with significant funneling, I would recommend that she remain in the hospital on modified bedrest until she reaches a more optimal gestational age (43 weeks).  At her current gestational age, I would be aggressive with tocolysis. Should she experience any contractions or lower abdominal cramping, consideration should be given to starting her on a course of Indocin (50 mg p.o. x1 followed by 25 mg p.o. every 6 hours to complete a 48-hour course) along with magnesium sulfate for fetal neuro protection. Magnesium sulfate should be given for fetal neuro protection prior to 32 weeks.  A rescue course of antenatal corticosteroids should be given should it be greater than 7 days since she received the initial course and she experiences lower abdominal cramping or preterm contractions.  Outpatient management on modified home bedrest may be considered should she remain undelivered at 28 weeks and beyond.  Due to the twin gestation, maternal obesity, and as she will be on prolonged modified bedrest in the hospital, I would recommend that she be started on prophylactic Lovenox (40 mg daily) along with sequential compression stockings for DVT prophylaxis.  An ultrasound should be scheduled for her next week at 24 weeks to assess the fetal growth and amniotic fluid levels.  The patient was advised that hopefully with medical management, we will help her achieve a successful pregnancy outcome.  She stated that she  is feeling much better following our consultation today.  At the end of the consultation, the patient stated that all her questions have been answered to her complete satisfaction.  Thank you for referring this very nice patient for a Maternal-Fetal Medicine consultation.  Please do not hesitate to call should there be any questions later in her  pregnancy.  Recommendations: -Inpatient management on modified bedrest until 28 weeks -Tocolysis with Indocin and magnesium sulfate for fetal neuro protection should she experience symptoms of preterm labor -Growth ultrasound at 24 weeks -Rescue course of steroids as indicated -Prophylactic Lovenox (40 mg daily) while on bedrest in the hospital -Outpatient management on modified home bedrest may be considered should she remain undelivered at 28 weeks and beyond

## 2019-11-28 NOTE — Progress Notes (Signed)
Pt. Reports no feelings of ctx or pressure this shift. She will be monitored by tocometry this PM.   Attempted FHRs by monitor, two RNs to the room for over 30 mins. Doppler used and two separate FHRs detected. Baby A picked up and HR detected at 150-155.  Baby B picked up and HR detected at 145-150.  Dr. Amado Nash called. She will be calling back.

## 2019-11-28 NOTE — Plan of Care (Signed)
  Problem: Elimination: Goal: Will not experience complications related to bowel motility Outcome: Progressing   Problem: Clinical Measurements: Goal: Ability to maintain clinical measurements within normal limits will improve Outcome: Progressing   

## 2019-11-29 LAB — TYPE AND SCREEN
ABO/RH(D): B POS
Antibody Screen: NEGATIVE

## 2019-11-29 MED ORDER — DOCUSATE SODIUM 100 MG PO CAPS
100.0000 mg | ORAL_CAPSULE | Freq: Two times a day (BID) | ORAL | Status: DC
  Administered 2019-11-29 – 2019-12-31 (×63): 100 mg via ORAL
  Filled 2019-11-29 (×63): qty 1

## 2019-11-29 NOTE — Progress Notes (Signed)
As a follow-up to electronic request, I visited w/pt. Her husband was bedside. She said she is okay today but to come back Monday (when he will no longer be there). She was appreciative of visit today. Staff Chaplain Elmarie Shiley, [facility]   11/29/19 1800  Clinical Encounter Type  Visited With Patient and family together

## 2019-11-29 NOTE — Plan of Care (Signed)
  Problem: Coping: Goal: Level of anxiety will decrease Outcome: Progressing   Problem: Elimination: Goal: Will not experience complications related to bowel motility Outcome: Progressing   

## 2019-11-29 NOTE — Progress Notes (Signed)
HD 4, preterm cervical change 23.3 wga, Di/Di twin gestation  No c/o; denies ctx/lof/vb H/o constipation, did restart miralax last night Trying to wear scds   Temp:  [97.5 F (36.4 C)-98.6 F (37 C)] 98.6 F (37 C) (02/20 1228) Pulse Rate:  [84-99] 99 (02/20 1228) Resp:  [17-18] 18 (02/20 1228) BP: (103-118)/(55-73) 116/71 (02/20 1228) SpO2:  [99 %-100 %] 100 % (02/20 1228) Weight:  [138.3 kg-140 kg] 138.3 kg (02/19 2055)   A&ox3 nml respirations Ab: soft, nt, gravid Le: no edema, nt bilat  FHT:  A: 150s, nml variability, +accels, no decels B: 150s, nml variability, +accels, no decels At times yesterday difficulty to trace but nurses used doppler for >70min at varisous times and had distinct fht  A/P:   30 w 3d DIDI twin IUP Preterm cervical change- no risk factors Reassuring fetal status S/p MFM consult: recommend growth u/s at 24 wga, lovenox for dvt prophylaxis (started on 70mg  daily based on pt weight), manage with tocolysis prn BMZ series complete Vaginal prometrium q day Contin inpatient managment

## 2019-11-30 NOTE — Progress Notes (Signed)
Had difficulty tracing baby A tonight for evening tracing. After approximately an hour of trying to adjust baby's tracing's, with assistance from my charge nurse, we used a doppler to confirm both babies heart tones.   Baby A- 148 Baby B- 147

## 2019-11-30 NOTE — Progress Notes (Signed)
Chart reviewed. Change monitoring to NST for twins once daily only, but monitor for UCs wit toco only q ahift and check FHTs only q shift and once/ daily NST

## 2019-11-30 NOTE — Progress Notes (Signed)
HD 5 Preterm cervical change 23.3wga, di/di twin gestation   No c/o, denies vaginal bleeding/lof; +FM x2  Temp:  [98.3 F (36.8 C)-98.8 F (37.1 C)] 98.6 F (37 C) (02/21 1226) Pulse Rate:  [84-100] 100 (02/21 1226) Resp:  [17-18] 18 (02/21 1226) BP: (109-119)/(58-68) 111/64 (02/21 1226) SpO2:  [98 %-100 %] 98 % (02/21 1226) Weight:  [140.2 kg] 140.2 kg (02/21 0300)  A&ox3 rrr ctab Abd: soft, nt, gravid LE: no edema, nt bilat LE  FHT:  A: 150s, nml variability, occ accel; difficult this am for 1 hr monitoring, fht 160 with doppler B: 150s, nml variability, occ accel; difficult this am for 1 hr monitoring, fht 148 with doppler About 5 min of both fht on monitor this am, will retry for longer monitoring in about an hour TOCO: no ctx  A/P: di/di twin gestation at 22.4 wga 1. Preterm cervical change - stable; contin prometrium q hs; bedrest 2. Fetal status reassuring x2, growth u/s planned at 24 wga 3. S/p nicu consult 4. S/p MFM consult with recommendation to begin lovenox for dvt prophylaxis 5. S/p bmz 6. Cephalic/transverse 11/26/19

## 2019-12-01 ENCOUNTER — Encounter (HOSPITAL_COMMUNITY): Payer: Self-pay

## 2019-12-01 NOTE — Progress Notes (Signed)
HD # 6 , Preterm cervical change 23.5wga, di/di twin gestation  No c/o, denies vaginal bleeding/LOF/ contractions. +FM  No anxiety/ depression   BP 106/61 (BP Location: Left Arm)   Pulse (!) 108   Temp 98.3 F (36.8 C) (Oral)   Resp 18   Ht 5\' 11"  (1.803 m)   Wt (!) 141.2 kg   LMP 06/18/2019   SpO2 99%   BMI 43.40 kg/m  A&ox3 Abd: soft, NT, gravid LE: no edema, No calf tenderness bilateral  FHT:  A: 150s, nml variability, occ accel; reactive for this gest age B: 150s, nml variability, occ accel; reactive for gest age   Toco: no ctx / none reported either   A/P: di/di twin gestation at 85.5 wga 1. Preterm cervical change - stable; continue vaginal prometrium q hs; bedrest 2. Fetal status reassuring x2, growth u/s planned at 24 wga. Change monitoring to toco q shift and FHTs daily and NST wkly?  3. S/p nicu consult 4. S/p MFM consult with recommendation to begin lovenox for dvt prophylaxis 5. S/p bmz 6. Cephalic/transverse 11/26/19. Desires C/section for delivery  Remains inpatient due to extreme prematurity  V.Murl Zogg MD

## 2019-12-02 NOTE — Progress Notes (Signed)
Hd 7 Preterm cervical change 23.6 wga, di/di twin gestation  No c/o, no lof/vb/ctx +fm x2; excited for u/s tomorrow and 24 wga tomorrow  Temp:  [98 F (36.7 C)-98.5 F (36.9 C)] 98 F (36.7 C) (02/23 0624) Pulse Rate:  [101-108] 101 (02/23 0624) Resp:  [18] 18 (02/23 0624) BP: (97-132)/(61-75) 97/62 (02/23 0624) SpO2:  [99 %-100 %] 100 % (02/23 0624) Weight:  [140.2 kg] 140.2 kg (02/23 0624)   A&ox3 nml respirations Abd: soft, nt, gravid LE: no edema, nt bilat  FHT:  A: 150s B: 140s, 150s Toco: none  A/P: di/di twins, 23.6 wga 1. Preterm cervical change - stable; continue vaginal prometrium q hs; bedrest 2. Fetal status reassuring x2, growth u/s planned at 24 wga. Monitoring: toco q shift and FHTs daily and NST wkly  3. S/p nicu consult 4. S/p MFM consult with recommendation to begin lovenox for dvt prophylaxis 5. S/p bmz 6. Cephalic/transverse 11/26/19. Desires C/section for delivery  Remains inpatient due to extreme prematurity

## 2019-12-02 NOTE — Progress Notes (Signed)
Attempted to visit with pt, but she was sleeping.  Will continue to follow while she is hospitalized.  Please page as further needs arise.  Maryanna Shape. Carley Hammed, M.Div. [facility] Chaplain Pager [phone] Office [phone]

## 2019-12-03 ENCOUNTER — Inpatient Hospital Stay (HOSPITAL_BASED_OUTPATIENT_CLINIC_OR_DEPARTMENT_OTHER): Payer: BC Managed Care – PPO

## 2019-12-03 DIAGNOSIS — O3432 Maternal care for cervical incompetence, second trimester: Secondary | ICD-10-CM | POA: Diagnosis not present

## 2019-12-03 DIAGNOSIS — O30042 Twin pregnancy, dichorionic/diamniotic, second trimester: Secondary | ICD-10-CM | POA: Diagnosis not present

## 2019-12-03 DIAGNOSIS — Z3A24 24 weeks gestation of pregnancy: Secondary | ICD-10-CM

## 2019-12-03 NOTE — Progress Notes (Signed)
Initial Nutrition Assessment  DOCUMENTATION CODES:  Morbid obesity  INTERVENTION:  Regular Diet Snacks and double protein portions if pt requests  NUTRITION DIAGNOSIS:  Increased nutrient needs related to (twin pregnancy and fetal growth requirements) as evidenced by (24 week twin pregancy).  GOAL:  Patient will meet greater than or equal to 90% of their needs  MONITOR:  Weight trends  REASON FOR ASSESSMENT:  Antenatal   ASSESSMENT:  24 weeks, Di-Di twins. Pre gravid wt 313 lbs, BMI 43.7. Initial 10 lb weight loss early preg. Now back to pre-preg wt. Hx of vitamin D deficiency, and elevated A1C.  Will be adm until delivery  Diet Order:   Diet Order            Diet regular Room service appropriate? Yes; Fluid consistency: Thin  Diet effective now              EDUCATION NEEDS:  No education needs have been identified at this time  Skin:  Skin Assessment: Reviewed RN Assessment Height:   Ht Readings from Last 1 Encounters:  11/28/19 5\' 11"  (1.803 m)  Weight:   Wt Readings from Last 1 Encounters:  12/03/19 (!) 141.2 kg  Ideal Body Weight:   155 lbs BMI:  Body mass index is 43.43 kg/m.  Estimated Nutritional Needs:   Kcal:  2600-2800  Protein:  115- 125 g  Fluid:  2.9L    12/05/19 M.Elisabeth Cara LDN Neonatal Nutrition Support Specialist/RD III

## 2019-12-03 NOTE — Progress Notes (Signed)
HD #7  24 wk Twins Cervical shortening  S: no complaint (+) FM x 2. BM x 2 today  O: BP 108/71 (BP Location: Left Arm)   Pulse (!) 102   Temp 97.7 F (36.5 C) (Oral)   Resp 18   Ht 5\' 11"  (1.803 m)   Wt (!) 141.2 kg   LMP 06/18/2019   SpO2 99%   BMI 43.43 kg/m  Breast soft pendulous Lungs clear to P Abd gravid soft Pelvic deferred   [place] MFM OB DETAIL +14 WK  Result Date: 12/03/2019 ----------------------------------------------------------------------  OBSTETRICS REPORT                       (Signed Final 12/03/2019 09:38 am) ---------------------------------------------------------------------- Patient Info  ID #:       [id number]                          D.O.B.:  08-Jul-1991 (28 yrs)  Name:       Paula Cross             Visit Date: 12/03/2019 07:43 am ---------------------------------------------------------------------- Performed By  Performed By:     Hurman Horn          Ref. Address:     Rocco Pauls                    RDMS                                                             [address]                                                             [id number]  Attending:        Noralee Space MD        [facility]  Referred By:      Vick Frees MD ---------------------------------------------------------------------- Orders   #  Description                          Code         Ordered By   1  [place] MFM OB DETAIL +14 WK              76811.01     SUSAN ALMQUIST   2  [place] MFM OB DETAIL ADDL GEST           46962.95     SUSAN ALMQUIST      +14 WK  ----------------------------------------------------------------------   #  Order #                    Accession #                 Episode #   1  [id number]                  [id number]                  [id number]   2  [id number]                  [id number]                  [id number]   ---------------------------------------------------------------------- Indications   [redacted] weeks gestation of pregnancy                Z3A.24   Twin pregnancy, di/di, second trimester        O30.042   Cervical incompetence, second trimester        O34.32   Encounter for antenatal screening for          Z36.3   malformations   Maternal morbid obesity (bmi=43.4)             O99.210 E66.01  ---------------------------------------------------------------------- Fetal Evaluation (Fetus A)  Num Of Fetuses:         2  Fetal Heart Rate(bpm):  141  Cardiac Activity:       Observed  Fetal Lie:              Lower Fetus  Presentation:           Cephalic  Placenta:               Anterior  P. Cord Insertion:      Visualized, central  Membrane Desc:      Dividing Membrane seen - Dichorionic.  Amniotic Fluid  AFI FV:      Within normal limits                              Largest Pocket(cm)                              6.37 ---------------------------------------------------------------------- Biometry (Fetus A)  BPD:      60.1  mm     G. Age:  24w 4d         62  %    CI:           79   %    70 - 86                                                          FL/HC:      20.1   %    18.7 - 20.9  HC:      213.8  mm     G. Age:  23w 3d         15  %    HC/AC:      1.09        1.05 - 1.21  AC:      195.3  mm     G. Age:  24w  2d         48  %    FL/BPD:     71.4   %    71 - 87  FL:       42.9  mm     G. Age:  24w 0d         38  %    FL/AC:      22.0   %    20 - 24  CER:      25.4  mm     G. Age:  23w 3d         37  %  Est. FW:     659  gm      1 lb 7 oz     45  %     FW Discordancy         1  % ---------------------------------------------------------------------- OB History  Gravidity:    1 ---------------------------------------------------------------------- Gestational Age (Fetus A)  LMP:           24w 0d        Date:  06/18/19                 EDD:   03/24/20  U/S Today:     24w 1d                                        EDD:   03/23/20   Best:          24w 0d     Det. By:  LMP  (06/18/19)          EDD:   03/24/20 ---------------------------------------------------------------------- Anatomy (Fetus A)  Cranium:               Appears normal         LVOT:                   Appears normal  Cavum:                 Appears normal         Aortic Arch:            Appears normal  Ventricles:            Not well visualized    Ductal Arch:            Appears normal  Choroid Plexus:        Not well visualized    Diaphragm:              Appears normal  Cerebellum:            Appears normal         Stomach:                Appears normal, left                                                                        sided  Posterior Fossa:       Not well visualized    Abdomen:  Appears normal  Nuchal Fold:           Not applicable (>20    Abdominal Wall:         Appears nml (cord                         wks GA)                                        insert, abd wall)  Face:                  Appears normal         Cord Vessels:           Appears normal (3                         (orbits and profile)                           vessel cord)  Lips:                  Not well visualized    Kidneys:                Appear normal  Palate:                Not well visualized    Bladder:                Appears normal  Thoracic:              Appears normal         Spine:                  Ltd views no                                                                        intracranial signs of                                                                        NTD  Heart:                 Appears normal         Upper Extremities:      Appears normal                         (4CH, axis, and                         situs)  RVOT:                  Appears normal         Lower Extremities:  Appears normal ---------------------------------------------------------------------- Fetal Evaluation (Fetus B)  Num Of Fetuses:         2  Fetal Heart Rate(bpm):  140  Cardiac  Activity:       Observed  Fetal Lie:              Upper Fetus  Presentation:           Variable  Placenta:               Posterior  P. Cord Insertion:      Not well visualized  Membrane Desc:      Dividing Membrane seen - Dichorionic.  Amniotic Fluid  AFI FV:      Within normal limits                              Largest Pocket(cm)                              5.42 ---------------------------------------------------------------------- Biometry (Fetus B)  BPD:      58.4  mm     G. Age:  23w 6d         40  %    CI:         74.6   %    70 - 86                                                          FL/HC:      19.5   %    18.7 - 20.9  HC:      214.6  mm     G. Age:  23w 4d         16  %    HC/AC:      1.06        1.05 - 1.21  AC:      201.6  mm     G. Age:  24w 5d         66  %    FL/BPD:     71.6   %    71 - 87  FL:       41.8  mm     G. Age:  23w 4d         25  %    FL/AC:      20.7   %    20 - 24  Est. FW:     669  gm      1 lb 8 oz     50  %     FW Discordancy      0 \ 1 % ---------------------------------------------------------------------- Gestational Age (Fetus B)  LMP:           24w 0d        Date:  06/18/19                 EDD:   03/24/20  U/S Today:     24w 0d  EDD:   03/24/20  Best:          Darien Ramus 0d     Det. By:  LMP  (06/18/19)          EDD:   03/24/20 ---------------------------------------------------------------------- Anatomy (Fetus B)  Cranium:               Appears normal         Aortic Arch:            Not well visualized  Cavum:                 Not well visualized    Ductal Arch:            Appears normal  Ventricles:            Not well visualized    Diaphragm:              Appears normal  Choroid Plexus:        Not well visualized    Stomach:                Appears normal, left                                                                        sided  Cerebellum:            Not well visualized    Abdomen:                Appears normal  Posterior Fossa:        Not well visualized    Abdominal Wall:         Appears nml (cord                                                                        insert, abd wall)  Nuchal Fold:           Not applicable (>20    Cord Vessels:           Appears normal ([redacted]                         wks GA)                                        vessel cord)  Face:                  Appears normal         Kidneys:                Not well visualized                         (orbits and profile)  Lips:  Appears normal         Bladder:                Appears normal  Thoracic:              Appears normal         Spine:                  Not well visualized  Heart:                 Appears normal         Upper Extremities:      Not well visualized                         (4CH, axis, and                         situs)  RVOT:                  Not well visualized    Lower Extremities:      Not well visualized  LVOT:                  Appears normal  Other:  Fetus appears to be a female. ---------------------------------------------------------------------- Cervix Uterus Adnexa  Cervix  Not visualized (advanced GA >24wks)  Uterus  No abnormality visualized. ---------------------------------------------------------------------- Impression  Patient with dichorionic-diamniotic twin pregnancy was  admitted with cervical shortening.  On your office ultrasound  performed at [redacted] weeks gestation, the cervix measured 6 mm.  Patient takes vaginal progesterone daily.  Since admission, she is on Lovenox prophylaxis.  Patient had  MFM consultation.  Twin A: Lower fetus, cephalic presentation, anterior placenta.  Fetal growth is appropriate for gestational age.  Amniotic fluid  is normal and good fetal activity seen.  Fetal anatomy that  could be ascertained appears normal.  Twin B: Upper fetus, variable presentation, posterior  placenta. Fetal growth is appropriate for gestational age.  Amniotic fluid is normal and good fetal activity seen.  Fetal  anatomy that  could be ascertained appears normal.  Growth discordancy: 1% (normal).  On transabdominal scan, the cervix could not be assessed.  Transvaginal ultrasound was not performed. ---------------------------------------------------------------------- Recommendations  -A limited ultrasound in 2 weeks.  -Fetal growth assessment in 4 weeks. ----------------------------------------------------------------------                  Noralee Space, MD Electronically Signed Final Report   12/03/2019 09:38 am ----------------------------------------------------------------------  [place] MFM OB DETAIL ADDL GEST +14 WK  Result Date: 12/03/2019 ----------------------------------------------------------------------  OBSTETRICS REPORT                       (Signed Final 12/03/2019 09:38 am) ---------------------------------------------------------------------- Patient Info  ID #:       [id number]                          D.O.B.:  1991-01-20 (28 yrs)  Name:       Paula Cross             Visit Date: 12/03/2019 07:43 am ---------------------------------------------------------------------- Performed By  Performed By:     Hurman Horn          Ref. Address:     Rocco Pauls  RDMS                                                             [address]                                                             [id number]  Attending:        Noralee Space MD        [facility]  Referred By:      Vick Frees MD ---------------------------------------------------------------------- Orders   #  Description                          Code         Ordered By   1  [place] MFM OB DETAIL +14 WK              76811.01     SUSAN ALMQUIST   2  [place] MFM OB DETAIL ADDL GEST           76811.02     SUSAN ALMQUIST      +14 WK  ----------------------------------------------------------------------   #  Order #                     Accession #                 Episode #   1  [id number]                  [id number]                  [id number]   2  [id number]                  [id number]                  [id number]  ---------------------------------------------------------------------- Indications   [redacted] weeks gestation of pregnancy                Z3A.24   Twin pregnancy, di/di, second trimester        O30.042   Cervical incompetence, second trimester        O34.32   Encounter for antenatal screening for          Z36.3   malformations   Maternal morbid obesity (bmi=43.4)             O99.210 E66.01  ---------------------------------------------------------------------- Fetal Evaluation (Fetus A)  Num Of Fetuses:         2  Fetal  Heart Rate(bpm):  141  Cardiac Activity:       Observed  Fetal Lie:              Lower Fetus  Presentation:           Cephalic  Placenta:               Anterior  P. Cord Insertion:      Visualized, central  Membrane Desc:      Dividing Membrane seen - Dichorionic.  Amniotic Fluid  AFI FV:      Within normal limits                              Largest Pocket(cm)                              6.37 ---------------------------------------------------------------------- Biometry (Fetus A)  BPD:      60.1  mm     G. Age:  24w 4d         62  %    CI:           79   %    70 - 86                                                          FL/HC:      20.1   %    18.7 - 20.9  HC:      213.8  mm     G. Age:  23w 3d         15  %    HC/AC:      1.09        1.05 - 1.21  AC:      195.3  mm     G. Age:  24w 2d         48  %    FL/BPD:     71.4   %    71 - 87  FL:       42.9  mm     G. Age:  24w 0d         38  %    FL/AC:      22.0   %    20 - 24  CER:      25.4  mm     G. Age:  23w 3d         37  %  Est. FW:     659  gm      1 lb 7 oz     45  %     FW Discordancy         1  % ---------------------------------------------------------------------- OB History  Gravidity:    1 ----------------------------------------------------------------------  Gestational Age (Fetus A)  LMP:           24w 0d        Date:  06/18/19                 EDD:   03/24/20  U/S Today:     24w 1d  EDD:   03/23/20  Best:          Darien Ramus 0d     Det. By:  LMP  (06/18/19)          EDD:   03/24/20 ---------------------------------------------------------------------- Anatomy (Fetus A)  Cranium:               Appears normal         LVOT:                   Appears normal  Cavum:                 Appears normal         Aortic Arch:            Appears normal  Ventricles:            Not well visualized    Ductal Arch:            Appears normal  Choroid Plexus:        Not well visualized    Diaphragm:              Appears normal  Cerebellum:            Appears normal         Stomach:                Appears normal, left                                                                        sided  Posterior Fossa:       Not well visualized    Abdomen:                Appears normal  Nuchal Fold:           Not applicable (>20    Abdominal Wall:         Appears nml (cord                         wks GA)                                        insert, abd wall)  Face:                  Appears normal         Cord Vessels:           Appears normal (3                         (orbits and profile)                           vessel cord)  Lips:                  Not well visualized    Kidneys:                Appear normal  Palate:  Not well visualized    Bladder:                Appears normal  Thoracic:              Appears normal         Spine:                  Ltd views no                                                                        intracranial signs of                                                                        NTD  Heart:                 Appears normal         Upper Extremities:      Appears normal                         (4CH, axis, and                         situs)  RVOT:                  Appears normal         Lower Extremities:       Appears normal ---------------------------------------------------------------------- Fetal Evaluation (Fetus B)  Num Of Fetuses:         2  Fetal Heart Rate(bpm):  140  Cardiac Activity:       Observed  Fetal Lie:              Upper Fetus  Presentation:           Variable  Placenta:               Posterior  P. Cord Insertion:      Not well visualized  Membrane Desc:      Dividing Membrane seen - Dichorionic.  Amniotic Fluid  AFI FV:      Within normal limits                              Largest Pocket(cm)                              5.42 ---------------------------------------------------------------------- Biometry (Fetus B)  BPD:      58.4  mm     G. Age:  23w 6d         40  %    CI:         74.6   %    70 - 86  FL/HC:      19.5   %    18.7 - 20.9  HC:      214.6  mm     G. Age:  23w 4d         16  %    HC/AC:      1.06        1.05 - 1.21  AC:      201.6  mm     G. Age:  24w 5d         66  %    FL/BPD:     71.6   %    71 - 87  FL:       41.8  mm     G. Age:  23w 4d         25  %    FL/AC:      20.7   %    20 - 24  Est. FW:     669  gm      1 lb 8 oz     50  %     FW Discordancy      0 \ 1 % ---------------------------------------------------------------------- Gestational Age (Fetus B)  LMP:           24w 0d        Date:  06/18/19                 EDD:   03/24/20  U/S Today:     24w 0d                                        EDD:   03/24/20  Best:          24w 0d     Det. By:  LMP  (06/18/19)          EDD:   03/24/20 ---------------------------------------------------------------------- Anatomy (Fetus B)  Cranium:               Appears normal         Aortic Arch:            Not well visualized  Cavum:                 Not well visualized    Ductal Arch:            Appears normal  Ventricles:            Not well visualized    Diaphragm:              Appears normal  Choroid Plexus:        Not well visualized    Stomach:                Appears normal, left                                                                         sided  Cerebellum:            Not well visualized    Abdomen:  Appears normal  Posterior Fossa:       Not well visualized    Abdominal Wall:         Appears nml (cord                                                                        insert, abd wall)  Nuchal Fold:           Not applicable (>20    Cord Vessels:           Appears normal ([redacted]                         wks GA)                                        vessel cord)  Face:                  Appears normal         Kidneys:                Not well visualized                         (orbits and profile)  Lips:                  Appears normal         Bladder:                Appears normal  Thoracic:              Appears normal         Spine:                  Not well visualized  Heart:                 Appears normal         Upper Extremities:      Not well visualized                         (4CH, axis, and                         situs)  RVOT:                  Not well visualized    Lower Extremities:      Not well visualized  LVOT:                  Appears normal  Other:  Fetus appears to be a female. ---------------------------------------------------------------------- Cervix Uterus Adnexa  Cervix  Not visualized (advanced GA >24wks)  Uterus  No abnormality visualized. ---------------------------------------------------------------------- Impression  Patient with dichorionic-diamniotic twin pregnancy was  admitted with cervical shortening.  On your office ultrasound  performed at [redacted] weeks gestation, the cervix measured 6 mm.  Patient takes vaginal progesterone daily.  Since admission, she is on Lovenox prophylaxis.  Patient had  MFM consultation.  Twin A: Lower  fetus, cephalic presentation, anterior placenta.  Fetal growth is appropriate for gestational age.  Amniotic fluid  is normal and good fetal activity seen.  Fetal anatomy that  could be ascertained appears normal.  Twin B:  Upper fetus, variable presentation, posterior  placenta. Fetal growth is appropriate for gestational age.  Amniotic fluid is normal and good fetal activity seen.  Fetal  anatomy that could be ascertained appears normal.  Growth discordancy: 1% (normal).  On transabdominal scan, the cervix could not be assessed.  Transvaginal ultrasound was not performed. ---------------------------------------------------------------------- Recommendations  -A limited ultrasound in 2 weeks.  -Fetal growth assessment in 4 weeks. ----------------------------------------------------------------------                  Tama High, MD Electronically Signed Final Report   12/03/2019 09:38 am ---------------------------------------------------------------------- tracing: baseline A: 153 bpm B: 149 bpm  IMP: cervical shortening  Twins( di-di) on lovenox  prophylaxis Concordant growth IUP @ 24 wk P) cont inpt.

## 2019-12-04 NOTE — Progress Notes (Signed)
Hospital day #8 24-week, 1 day Di-ditwins Cervical incompetence  Subjective: No complaints.  Good fetal movement x2, no leakage of fluid, no vaginal bleeding, no contractions patient remains comfortable on bedrest with between flat to 30 degree angle.  Able to have bowel movements and voids.  Bathroom privileges given yesterday the patient continues to void on a bedpan  Objective: Vitals:   12/03/19 1945 12/04/19 0648 12/04/19 0740 12/04/19 1155  BP:  (!) 108/48 108/73 114/63  Pulse:  92 93 91  Resp:  16 16 16   Temp:  98.5 F (36.9 C) 98.3 F (36.8 C) 98.4 F (36.9 C)  TempSrc:  Oral Oral Oral  SpO2: 99% 99% 97% 98%  Weight:      Height:       General: Well-appearing, no distress Abdomen: Soft, gravid, nontender GU: Deferred Lower extremity: Nontender, no edema  Ultrasound February 24 at 24 weeks and 0 days.  Concordant growth.  Normal AFI.  Cervical length not assessed.  A vertex, B variable lie  Assessment plan: 29 year old G1 at 24 weeks 1 day with di-ditwins and cervical incompetence.  Patient on vaginal Prometrium and bedrest.  Clinical picture stable at this time.  On Lovenox prophylaxis.  Continue inpatient management.  26 12/04/2019 12:51 PM

## 2019-12-05 LAB — TYPE AND SCREEN
ABO/RH(D): B POS
ABO/RH(D): B POS
Antibody Screen: NEGATIVE
Antibody Screen: NEGATIVE

## 2019-12-05 NOTE — Progress Notes (Signed)
Hospital day #9 24-week, 2 day Di-Di twins Cervical incompetence  Subjective: No complaints.  Good fetal movement x2, no leakage of fluid, no vaginal bleeding, no contractions patient remains comfortable on bedrest with between flat to 30 degree angle.  Able to have bowel movements and voids, has some bathroom privileges Objective: Vitals:   12/04/19 2242 12/05/19 0630 12/05/19 0822 12/05/19 1139  BP: (!) 108/54  (!) 110/58 105/65  Pulse: 94  99 99  Resp: 18  18 18   Temp: 98.5 F (36.9 C)  98.3 F (36.8 C) 97.9 F (36.6 C)  TempSrc: Oral  Oral Oral  SpO2: 99%  98% 100%  Weight:  (!) 140.8 kg    Height:       General: Well-appearing, no distress Abdomen: Soft, gravid, nontender GU: Deferred Lower extremity: Nontender, no edema  FHTs daily x2 , nl   Ultrasound February 24 at 24 weeks and 0 days.  Concordant growth.  Normal AFI.  Cervical length not assessed.  A vertex, B variable lie  Assessment plan: 29 year old G1 at 24.2  wks  di-di twins and cervical incompetence.  Patient on vaginal Prometrium and bedrest.  Stable.   On Lovenox prophylaxis.  Continue inpatient management.  26 12/05/2019 12:07 PM

## 2019-12-06 NOTE — Progress Notes (Signed)
Hospital day #10 Preterm cervical change 24w 3d DIDI twins  Subjective: Patient notes good fetal movement x2.  No leakage of fluid though more discharge after vaginal Prometrium.  No vaginal bleeding.  Patient is tolerating bedrest at this time.  She has  had a bowel movement.  She reports no chest pain or shortness of breath.  She reports no lower extremity swelling and has been wearing her SCDs. No change in DC or contractions noted.  Objective: Vitals:   12/05/19 1924 12/05/19 2227 12/06/19 0645 12/06/19 0826  BP: 131/68 128/64  138/84  Pulse: 100 (!) 106  (!) 118  Resp: 18 18  18   Temp: 98 F (36.7 C) 98 F (36.7 C)  98.1 F (36.7 C)  TempSrc: Oral Oral  Oral  SpO2: 100% 100%  99%  Weight:   (!) 141.6 kg   Height:       General: Well-appearing, no distress NCAT Neck : supple with FROM Lungs: CTA CV: RRR Abdomen: Obese, gravid, no fundal tenderness, no right upper quadrant pain No CVAT Skin: intact Neuro: nonfocal GU: Deferred Lower extremity: Nontender, no edema  Toco: None NST: A: 150s, +10 x 10 accelerations, no decelerations, 5-8 beat variability, difficult to get prolonged tracing due to early gestational age and maternal obesity however fetal movement noticed by patient and heard during 1 hour of monitoring NST: B: 150s, +10 x 10 accelerations, no decelerations, 5-8 beat variability, difficult to get prolonged tracing due to early gestational age and maternal obesity however fetal movement noticed by patient and heard during 1 hour of monitoring  Reassuring FHR tracings x 3  CBC    Component Value Date/Time   WBC 9.8 11/26/2019 1215   RBC 4.37 11/26/2019 1215   HGB 11.5 (L) 11/26/2019 1215   HCT 35.7 (L) 11/26/2019 1215   PLT 285 11/26/2019 1215   MCV 81.7 11/26/2019 1215   MCH 26.3 11/26/2019 1215   MCHC 32.2 11/26/2019 1215   RDW 15.8 (H) 11/26/2019 1215   LYMPHSABS 0.9 11/26/2019 1215   MONOABS 0.6 11/26/2019 1215   EOSABS 0.1 11/26/2019 1215   BASOSABS 0.0 11/26/2019 1215    Assessment plan:   24 w 3d DIDI twin IUP Preterm cervical change- no risk factors Reassuring fetal status MFM note pending BMZ series complete Vaginal prometrium   Dheeraj Hail J 12/06/2019 11:02 AM

## 2019-12-06 NOTE — Plan of Care (Signed)
  Problem: Coping: Goal: Level of anxiety will decrease Outcome: Progressing   Pt states feeling much better about being in the hospital. and also states it is relieving knowing that she not alone. Pt has developed coping mechanisms to help with her anxiety. Pt uses coloring and working as distractions when she begins to feel overwhelmed.

## 2019-12-07 NOTE — Progress Notes (Signed)
Hospital day #11 Preterm cervical change 24w 4d DIDI twins  Subjective: Patient notes good fetal movement x2.  No leakage of fluid though more discharge after vaginal Prometrium.  No vaginal bleeding.  Patient is tolerating bedrest at this time.  She has  had a bowel movement.  She reports no chest pain or shortness of breath.  She reports no lower extremity swelling and has been wearing her SCDs. No change in DC or contractions noted.  Objective: Vitals:   12/06/19 1628 12/06/19 1931 12/07/19 0621 12/07/19 0927  BP: 133/65 117/70  108/63  Pulse: 96 (!) 101  (!) 111  Resp: 18 18  18   Temp: 97.9 F (36.6 C) 97.9 F (36.6 C)  98.1 F (36.7 C)  TempSrc: Oral Axillary  Oral  SpO2: 98% 98%  100%  Weight:   (!) 140.2 kg   Height:       General: Well-appearing, no distress NCAT Neck : supple with FROM Lungs: CTA CV: RRR Abdomen: Obese, gravid, no fundal tenderness, no right upper quadrant pain No CVAT Skin: intact Neuro: nonfocal GU: Deferred Lower extremity: Nontender, no edema  Toco: None NST: A: 150s, +10 x 10 accelerations, no decelerations, 5-8 beat variability, difficult to get prolonged tracing due to early gestational age and maternal obesity however fetal movement noticed by patient and heard during 1 hour of monitoring NST: B: 150s, +10 x 10 accelerations, no decelerations, 5-8 beat variability, difficult to get prolonged tracing due to early gestational age and maternal obesity however fetal movement noticed by patient and heard during 1 hour of monitoring  Reassuring FHR tracings x 3  CBC    Component Value Date/Time   WBC 9.8 11/26/2019 1215   RBC 4.37 11/26/2019 1215   HGB 11.5 (L) 11/26/2019 1215   HCT 35.7 (L) 11/26/2019 1215   PLT 285 11/26/2019 1215   MCV 81.7 11/26/2019 1215   MCH 26.3 11/26/2019 1215   MCHC 32.2 11/26/2019 1215   RDW 15.8 (H) 11/26/2019 1215   LYMPHSABS 0.9 11/26/2019 1215   MONOABS 0.6 11/26/2019 1215   EOSABS 0.1 11/26/2019 1215   BASOSABS 0.0 11/26/2019 1215    Assessment plan:   24 w 4d DIDI twin IUP Preterm cervical change- no risk factors Reassuring fetal status MFM note pending BMZ series complete Vaginal prometrium   Toren Tucholski J 12/07/2019 11:47 AM

## 2019-12-08 LAB — TYPE AND SCREEN
ABO/RH(D): B POS
Antibody Screen: NEGATIVE

## 2019-12-08 NOTE — Progress Notes (Signed)
HD #12 24 5/7 wk ( di-di twins) Cervical shortening S; no complaint. Active fetuses c/o issue with incomplete food orders  O; BP 121/72 (BP Location: Left Arm)   Pulse (!) 103   Temp 97.9 F (36.6 C) (Oral)   Resp 18   Ht 5\' 11"  (1.803 m)   Wt (!) 139.3 kg   LMP 06/18/2019   SpO2 100%   BMI 42.82 kg/m  Lungs clear to P Abd gravid  Soft Pelvic deferred  tracing: A: FHR 164  Twin B: FHR 147  no ctx  CBC Latest Ref Rng & Units 11/26/2019 08/20/2019  WBC 4.0 - 10.5 K/uL 9.8 10.8(H)  Hemoglobin 12.0 - 15.0 g/dL 11.5(L) 13.2  Hematocrit 36.0 - 46.0 % 35.7(L) 40.9  Platelets 150 - 400 K/uL 285 278   IMP: cervical shortening on vaginal prometrium Twin gestation ( di-di) IUP @ 24 5/7 week P) cont inpt mgmt

## 2019-12-09 NOTE — Progress Notes (Signed)
Hospital day #13 24-week, 6 day Di-Di twins Cervical incompetence  Subjective: No complaints.  Good fetal movement x2, no leakage of fluid, no vaginal bleeding, no contractions. Looking for forward to going home at 28 wks if stable.   Objective: Vitals:   12/09/19 0629 12/09/19 0910 12/09/19 1226 12/09/19 1630  BP:  119/72 120/61 112/74  Pulse:  (!) 113 (!) 107 (!) 109  Resp:  18 20 18   Temp:  97.8 F (36.6 C) 98.6 F (37 C) 98.4 F (36.9 C)  TempSrc:  Oral Oral Oral  SpO2:  100% 100% 100%  Weight: (!) 137.8 kg     Height:       General: Well-appearing, no distress Abdomen: Soft, gravid, nontender GU: Deferred Lower extremity: Nontender, no edema  FHTs daily x2 , nl   Ultrasound 12/03/19 at  24 weeks and 0 days.  Concordant growth.  Normal AFI.  Cervical length not assessed.  A vertex, B variable lie  Assessment plan: 29 year old G1 at 24.6  wks  di-di twins and cervical incompetence.  Patient on vaginal Prometrium and bedrest.  Stable.   On Lovenox prophylaxis.  Continue inpatient management. BTMZ, NICU consult done Growth q4 wks (next at 28 wks), interval sono for twin (limited) at 86 wks  30 12/09/2019

## 2019-12-10 NOTE — Progress Notes (Signed)
Hospital day #14 25-week, 0 day Di-Di twins Cervical incompetence  Subjective: No complaints.  Good fetal movement x2, no leakage of fluid, no vaginal bleeding, no contractions. Looking for forward to going home at 28 wks if stable.   Objective: Vitals:   12/09/19 2012 12/10/19 0051 12/10/19 0637 12/10/19 1132  BP: 120/72 108/83 (!) 113/50 118/69  Pulse: (!) 104 98 98 (!) 104  Resp: 18 18 18 18   Temp: 99 F (37.2 C) 98.2 F (36.8 C) 98 F (36.7 C) 98.7 F (37.1 C)  TempSrc: Oral Oral Oral Oral  SpO2: 98% 100% 100% 100%  Weight:   (!) 138.4 kg   Height:       General: Well-appearing, no distress Abdomen: Soft, gravid, nontender GU: Deferred Lower extremity: Nontender, no edema  FHTs daily x2 , nl   Ultrasound 12/03/19 at  24 weeks and 0 days.  Concordant growth.  Normal AFI.  Cervical length not assessed.  A vertex, B variable lie  Assessment plan: 29 year old G1 at 25.0  wks  di-di twins and cervical incompetence.  Patient on vaginal Prometrium and bedrest.  Stable.   On Lovenox prophylaxis.  Continue inpatient management. BTMZ, NICU consult done Growth q4 wks (next at 28 wks), interval sono for twin (limited) at 26 wks  26 12/10/2019

## 2019-12-11 ENCOUNTER — Inpatient Hospital Stay (HOSPITAL_BASED_OUTPATIENT_CLINIC_OR_DEPARTMENT_OTHER): Payer: BC Managed Care – PPO

## 2019-12-11 DIAGNOSIS — O3432 Maternal care for cervical incompetence, second trimester: Secondary | ICD-10-CM

## 2019-12-11 DIAGNOSIS — O30042 Twin pregnancy, dichorionic/diamniotic, second trimester: Secondary | ICD-10-CM | POA: Diagnosis not present

## 2019-12-11 DIAGNOSIS — Z3A25 25 weeks gestation of pregnancy: Secondary | ICD-10-CM | POA: Diagnosis not present

## 2019-12-11 LAB — TYPE AND SCREEN
ABO/RH(D): B POS
Antibody Screen: NEGATIVE

## 2019-12-11 NOTE — Progress Notes (Signed)
Hospital day #15 Preterm cervical change 25w 1d DIDI twins  Subjective: Patient notes good fetal movement x2.  No leakage of fluid though more discharge after vaginal Prometrium.  No vaginal bleeding.  Patient is tolerating bedrest at this time.  She has  had a bowel movement.  She reports no chest pain or shortness of breath.  She reports no lower extremity swelling and has been wearing her SCDs. No change in DC or contractions noted.  Objective: Vitals:   12/10/19 1912 12/10/19 2233 12/11/19 0608 12/11/19 0847  BP: 116/72 (!) 124/92  127/72  Pulse: (!) 111 (!) 111  (!) 120  Resp: 18 18  20   Temp: 98.8 F (37.1 C) 98.1 F (36.7 C)  97.9 F (36.6 C)  TempSrc: Oral Oral  Oral  SpO2: 100% 99%  100%  Weight:   (!) 139.3 kg   Height:       General: Well-appearing, no distress NCAT Neck : supple with FROM Lungs: CTA CV: RRR Abdomen: Obese, gravid, no fundal tenderness, no right upper quadrant pain No CVAT Skin: intact Neuro: nonfocal GU: Deferred Lower extremity: Nontender, no edema  Toco: None NST: A: 150s, +10 x 10 accelerations, no decelerations, 5-8 beat variability, difficult to get prolonged tracing due to early gestational age and maternal obesity however fetal movement noticed by patient and heard during 1 hour of monitoring NST: B: 150s, +10 x 10 accelerations, no decelerations, 5-8 beat variability, difficult to get prolonged tracing due to early gestational age and maternal obesity however fetal movement noticed by patient and heard during 1 hour of monitoring  Reassuring FHR tracings x 3  CBC    Component Value Date/Time   WBC 9.8 11/26/2019 1215   RBC 4.37 11/26/2019 1215   HGB 11.5 (L) 11/26/2019 1215   HCT 35.7 (L) 11/26/2019 1215   PLT 285 11/26/2019 1215   MCV 81.7 11/26/2019 1215   MCH 26.3 11/26/2019 1215   MCHC 32.2 11/26/2019 1215   RDW 15.8 (H) 11/26/2019 1215   LYMPHSABS 0.9 11/26/2019 1215   MONOABS 0.6 11/26/2019 1215   EOSABS 0.1 11/26/2019  1215   BASOSABS 0.0 11/26/2019 1215    Assessment plan:   25 w 1d DIDI twin IUP Preterm cervical change- no risk factors Reassuring fetal status BMZ series complete Vaginal prometrium Will ck CL today by 11/28/2019 12/11/2019 9:22 AM

## 2019-12-12 NOTE — Progress Notes (Signed)
HD #16 Preterm cervical change 25.2 wga; di/di twin gestation  Wondering when she can go home - really wants to go home; started getting tearful - stated that she can't eat the food her and is now paying for food (brought in by husband) - frustrated by uncooked eggs, dirty cups, getting orders wrong, etc No lof/vb/ctx; +FM x2  Patient Vitals for the past 24 hrs:  BP Temp Temp src Pulse Resp SpO2 Weight  12/12/19 0826 118/71 98.2 F (36.8 C) Oral (!) 111 18 99 % --  12/12/19 0634 (!) 110/57 98.2 F (36.8 C) Oral (!) 111 18 98 % (!) 140.3 kg  12/11/19 2219 118/67 98.4 F (36.9 C) Oral (!) 101 20 97 % --  12/11/19 1944 113/72 98.4 F (36.9 C) Oral (!) 114 20 100 % --  12/11/19 1607 112/73 98.6 F (37 C) Oral (!) 110 20 100 % --  12/11/19 1208 106/73 98.4 F (36.9 C) Oral (!) 103 20 100 % --   A&ox3 nml respirations Abd: soft,nt,nd LE: no edema, nt bilat  U/s (12/11/19): cephalic/breech, nml afi x2; cx not appreciated (same as u/s 2/24)  FHT:  A: 155 B: 145 Toco: none x3  A/P: di/di twins, 25.2 wga 1. perterm cervical change - stable, contin modified bedrest; inpatient management; contin vaginal prometrium; u/s ordered for cx length, no measurable cx, same as prior u/s; spent time discussing high risk status and plan to re-evaluate at 28 wga, discussed many variables so unable to state if/when can have outpatient management; spoke with Marylene [employer] promotion account executive of OB high risk) about food issues and she will work with patient Teacher, early years/pre of food service to help alleviate this issue for the patient 2. Preterm - s/p bmz x2 3. Fetal status reassuring; contin daily doppler, toco q shift 4. Cephalic, breech

## 2019-12-13 NOTE — Progress Notes (Signed)
HD # 17 25 3/7 weeks  S; food issue has been addressed (+) FM denies ctx or vaginal discharge  O: BP 101/61 (BP Location: Left Arm)   Pulse 100   Temp 98.4 F (36.9 C) (Oral)   Resp 18   Ht 5\' 11"  (1.803 m)   Wt (!) 141.4 kg   LMP 06/18/2019   SpO2 99%   BMI 43.48 kg/m  Lungs clear to P Cor RRR Breast soft  Abd' gravid no palp ctx Pelvic deferred Extr no edema or calf tenderness  Tracing( a); FHR 145 (b) FHR 135 No ctx  IMP: cervical shortening Twin gestation P) cont present mgmt

## 2019-12-13 NOTE — Plan of Care (Signed)
Patient remains on bathroom only and modified bedrest.Denies any complaints at this time.

## 2019-12-13 NOTE — Plan of Care (Signed)
Antepartum care plan added Carmelina Dane, RN

## 2019-12-14 NOTE — Progress Notes (Signed)
HD #18 Twins 25 4/7 wk  S; no complaint Good FM ? If still having sono on wednesday O: .BP 107/60 (BP Location: Right Arm)   Pulse 100   Temp 98.4 F (36.9 C) (Oral)   Resp 18   Ht 5\' 11"  (1.803 m)   Wt (!) 141.2 kg   LMP 06/18/2019   SpO2 100%   BMI 43.42 kg/m  Lungs clear to A Cor RRR  abdomen gravid soft Pad none  extr no edema  Tracing( A) FHR 140 (B) 145 No ctx  IMP cervical shortening  Di-di twin gestation @ 25 4/7 week P) cont inpt

## 2019-12-15 LAB — TYPE AND SCREEN
ABO/RH(D): B POS
Antibody Screen: NEGATIVE

## 2019-12-15 NOTE — Progress Notes (Signed)
HD #19 Twins 25 5/7 wk  S; no complaint Good FM.  No leakage of fluid.  No vaginal bleeding good fetal movement x2  O: .BP 113/66 (BP Location: Left Arm)   Pulse (!) 119   Temp 98.5 F (36.9 C) (Oral)   Resp 18   Ht 5\' 11"  (1.803 m)   Wt (!) 143.4 kg   LMP 06/18/2019   SpO2 99%   BMI 44.10 kg/m  General: Lying in bed in no distress. Psychiatric.  Mood and affect appropriate for situation  abdomen gravid soft   extr no edema  Fetal heart tones positive x2 No ctx  IMP cervical shortening  Di-di twin gestation @ 25 5/7 week P) cont inpt.  Continue vaginal Prometrium Appropriate fetal testing ultrasounds as outlined in MFM's note  07-[birth year]-1996 12/15/2019 8:16 AM

## 2019-12-16 NOTE — Progress Notes (Signed)
HD #20 Twins 25 6/7 wk  S; no complaint. Ready to go home.  Good FM.  No leakage of fluid.  No vaginal bleeding good fetal movement x2  O: .BP 109/64 (BP Location: Left Arm)   Pulse (!) 116   Temp 98 F (36.7 C) (Oral)   Resp 18   Ht 5\' 11"  (1.803 m)   Wt (!) 142.6 kg   LMP 06/18/2019   SpO2 98%   BMI 43.85 kg/m  General: Lying in bed in no distress. Mood and affect appropriate Abdomen gravid soft, no UCs or tenderness  Extr no edema  Fetal heart tones positive x2 No ctx  IMP cervical shortening  Di-di twin gestation @ 25.6/7 week P) cont inpt.  Continue vaginal Prometrium Appropriate fetal testing ultrasounds, growth at 28 wks, interval sono at 26 wks per MFM  08/18/2019 12/16/2019 2:40 PM

## 2019-12-17 ENCOUNTER — Inpatient Hospital Stay (HOSPITAL_BASED_OUTPATIENT_CLINIC_OR_DEPARTMENT_OTHER): Payer: BC Managed Care – PPO

## 2019-12-17 DIAGNOSIS — O3432 Maternal care for cervical incompetence, second trimester: Secondary | ICD-10-CM | POA: Diagnosis not present

## 2019-12-17 DIAGNOSIS — O30042 Twin pregnancy, dichorionic/diamniotic, second trimester: Secondary | ICD-10-CM

## 2019-12-17 DIAGNOSIS — Z3A26 26 weeks gestation of pregnancy: Secondary | ICD-10-CM

## 2019-12-17 DIAGNOSIS — O99212 Obesity complicating pregnancy, second trimester: Secondary | ICD-10-CM | POA: Diagnosis not present

## 2019-12-17 LAB — TYPE AND SCREEN
ABO/RH(D): B POS
Antibody Screen: NEGATIVE

## 2019-12-17 NOTE — Progress Notes (Signed)
HD #21 Twins 26 0/7 wk  S; no complaint. Ready to go home.  Good FM.  No leakage of fluid.  No vaginal bleeding good fetal movement x2  O: .BP 111/68   Pulse (!) 104   Temp 98.3 F (36.8 C) (Oral)   Resp 20   Ht 5\' 11"  (1.803 m)   Wt (!) 141.1 kg   LMP 06/18/2019   SpO2 100%   BMI 43.39 kg/m  General: Lying in bed in no distress. Mood and affect appropriate Abdomen gravid soft, no UCs or tenderness  Extr no edema  Fetal heart tones positive x2 No ctx  IMP cervical shortening  Di-di twin gestation @ 26 0/7 week P) cont inpt.  Continue vaginal Prometrium Appropriate fetal testing ultrasounds, growth at 28 wks, interval sono today  11-06-2002 12/17/2019 11:19 AM    Note written but not signed. Late completion of note 02/16/2020 12/20/2019 8:50 AM

## 2019-12-18 NOTE — Progress Notes (Addendum)
Hospital day #22` Preterm cervical change 26w 1d DIDI twins  Subjective: Patient notes good fetal movement x2.  No leakage of fluid though more discharge after vaginal Prometrium.  No vaginal bleeding.  Patient is tolerating bedrest at this time.  She has  had a bowel movement.  She reports no chest pain or shortness of breath.  She reports no lower extremity swelling and has been wearing her SCDs. No change in DC or contractions noted.  Objective: Vitals:   12/17/19 1159 12/17/19 1627 12/17/19 1955 12/18/19 0500  BP: 111/62 107/72 107/68   Pulse: (!) 108 (!) 101 (!) 114   Resp: 20 18 17    Temp: 98.2 F (36.8 C) 98 F (36.7 C) 97.7 F (36.5 C)   TempSrc: Oral Oral Oral   SpO2: 100% 100% 99%   Weight:    (!) 141.4 kg  Height:       General: Well-appearing, no distress NCAT Neck : supple with FROM Lungs: CTA CV: RRR Abdomen: Obese, gravid, no fundal tenderness, no right upper quadrant pain No CVAT Skin: intact Neuro: nonfocal GU: Deferred Lower extremity: Nontender, no edema  Toco: None NST: A: 150s, +10 x 10 accelerations, no decelerations, 5-8 beat variability, difficult to get prolonged tracing due to early gestational age and maternal obesity however fetal movement noticed by patient and heard during 1 hour of monitoring NST: B: 150s, +10 x 10 accelerations, no decelerations, 5-8 beat variability, difficult to get prolonged tracing due to early gestational age and maternal obesity however fetal movement noticed by patient and heard during 1 hour of monitoring  Reassuring FHR tracings x 3  CBC    Component Value Date/Time   WBC 9.8 11/26/2019 1215   RBC 4.37 11/26/2019 1215   HGB 11.5 (L) 11/26/2019 1215   HCT 35.7 (L) 11/26/2019 1215   PLT 285 11/26/2019 1215   MCV 81.7 11/26/2019 1215   MCH 26.3 11/26/2019 1215   MCHC 32.2 11/26/2019 1215   RDW 15.8 (H) 11/26/2019 1215   LYMPHSABS 0.9 11/26/2019 1215   MONOABS 0.6 11/26/2019 1215   EOSABS 0.1 11/26/2019 1215   BASOSABS 0.0 11/26/2019 1215    Assessment plan:   26 w 1d DIDI twin IUP Preterm cervical change- no risk factors Reassuring fetal status BMZ series complete Vaginal prometrium   Dimitrius Steedman J 12/18/2019 9:27 AM

## 2019-12-19 NOTE — Progress Notes (Signed)
HD #23 Twins 26 2/7 cervical shortening Cervical shortenining  S: no complaint Active fetuses Denies ctx, vaginal discharge  O: BP 110/66 (BP Location: Left Arm)   Pulse (!) 111   Temp 98.2 F (36.8 C) (Oral)   Resp 18   Ht 5\' 11"  (1.803 m)   Wt (!) 141.2 kg   LMP 06/18/2019   SpO2 99%   BMI 43.42 kg/m   Lungs clear to P abd gravid soft  ext no edema  pad none  FHR x 2 145/140  IMP: cervical shortening Twin gestation @ 26 2/7 wk P) cont inpt mgmt. Check growth 2 wk

## 2019-12-19 NOTE — Plan of Care (Signed)
  Problem: Education: Goal: Knowledge of disease or condition will improve Outcome: Completed/Met Goal: Knowledge of the prescribed therapeutic regimen will improve Outcome: Completed/Met   

## 2019-12-20 LAB — TYPE AND SCREEN
ABO/RH(D): B POS
Antibody Screen: NEGATIVE

## 2019-12-20 MED ORDER — FAMOTIDINE 20 MG PO TABS
20.0000 mg | ORAL_TABLET | Freq: Two times a day (BID) | ORAL | Status: DC
  Administered 2019-12-20 – 2019-12-31 (×22): 20 mg via ORAL
  Filled 2019-12-20 (×22): qty 1

## 2019-12-20 MED ORDER — TETANUS-DIPHTH-ACELL PERTUSSIS 5-2.5-18.5 LF-MCG/0.5 IM SUSP
0.5000 mL | Freq: Once | INTRAMUSCULAR | Status: AC
  Administered 2019-12-31: 0.5 mL via INTRAMUSCULAR
  Filled 2019-12-20: qty 0.5

## 2019-12-20 NOTE — Progress Notes (Signed)
Pt reports no ctx, pain or pressure during monitoring.  Pt reports good fetal movement from both babies and has no concerns at this time.

## 2019-12-20 NOTE — Plan of Care (Signed)
  Problem: Education: Goal: Knowledge of disease or condition will improve Outcome: Completed/Met Goal: Knowledge of the prescribed therapeutic regimen will improve Outcome: Completed/Met   Problem: Education: Goal: Knowledge of General Education information will improve Description: Including pain rating scale, medication(s)/side effects and non-pharmacologic comfort measures Outcome: Completed/Met   Problem: Activity: Goal: Risk for activity intolerance will decrease Outcome: Completed/Met   Problem: Nutrition: Goal: Adequate nutrition will be maintained Outcome: Completed/Met   Problem: Coping: Goal: Level of anxiety will decrease Outcome: Completed/Met   Problem: Elimination: Goal: Will not experience complications related to bowel motility Outcome: Completed/Met Goal: Will not experience complications related to urinary retention Outcome: Completed/Met

## 2019-12-20 NOTE — Progress Notes (Addendum)
HD #24 Twins 26 3/7 cervical shortening   S: no complaint. Just feeling down from being here so long.  Active fetuses Denies ctx, vaginal discharge  O: BP 117/73 (BP Location: Left Arm)   Pulse (!) 119   Temp 97.8 F (36.6 C) (Oral)   Resp 20   Ht 5\' 11"  (1.803 m)   Wt (!) 142.5 kg   LMP 06/18/2019   SpO2 100%   BMI 43.80 kg/m  abd gravid soft ext no edema, no calf tenderness/ cords  pad no dc or spotting   FHR x 2 145/140s   IMP: cervical shortening Twin gestation @ 26 3/7 wk 1hr Glucola at 27 wks TDAP, CBC and RPR at 28 wks. Repeat growth sono at 28 wks  -V.Shaneece Stockburger MD

## 2019-12-21 NOTE — Progress Notes (Signed)
HD #25 Twins 26 4/7 cervical shortening  S: no complaint.  Cheerful today. Sat out in antepartum outdoor space for a few minutes yesterday  Active FMs  Denies ctx, vaginal discharge  O: BP 106/62 (BP Location: Left Arm)   Pulse (!) 107   Temp 98.1 F (36.7 C) (Oral)   Resp 19   Ht 5\' 11"  (1.803 m)   Wt (!) 141.3 kg   LMP 06/18/2019   SpO2 99%   BMI 43.45 kg/m  abd gravid soft, non tender, no palpable UCs  ext no edema, no calf tenderness/ cords  pad no dc or spotting   FHR x 2 145/140s- 10x10 variability, no decels, + accels- reactive for 26 wks x2  Toco no UCs   IMP: cervical shortening Twin gestation @ 26 4/7 wk 1hr Glucola at 27 wks TDAP, CBC and RPR at 28 wks. Repeat growth sono at 28 wks  -V.Leandre Wien MD

## 2019-12-22 ENCOUNTER — Encounter (HOSPITAL_COMMUNITY): Payer: Self-pay | Admitting: Obstetrics

## 2019-12-22 NOTE — Progress Notes (Signed)
HD #25 Twins 26 5/7 cervical shortening  S: no complaint.  Cheerful today, still very focused on whether she will be able to go home at 28 weeks.  Patient would like to go home if her cervix is visibly closed on speculum exam but would like to staining to 30 weeks her cervix is visibly dilated.  Patient also aware fetal size, position, effacement, contractions all impact this decision. Active FMs  Denies ctx, vaginal discharge  O: BP 109/62   Pulse (!) 106   Temp 98.2 F (36.8 C) (Oral)   Resp 18   Ht 5\' 11"  (1.803 m)   Wt (!) 143.8 kg   LMP 06/18/2019   SpO2 100%   BMI 44.21 kg/m  abd gravid soft, non tender, no palpable UCs  ext no edema, no calf tenderness/ cords  pad no dc or spotting   FHR x 2 145/140s- 10x10 variability, no decels, + accels- reactive for 26 wks x2  Toco no UCs   IMP: cervical shortening Twin gestation @ 26 5/7 wk 1hr Glucola at 27 wks TDAP, CBC and RPR at 28 wks. Repeat growth sono at 28 wks, will check cervical length vaginally Will plan sterile speculum exam: Afternoon Monday, March 22 to help with planning. Dispo.  Possible home on Wednesday, March 24 if cervical exam stable.  Patient will need wheelchair for home.  March 26 12/22/2019 9:09 AM

## 2019-12-23 LAB — TYPE AND SCREEN
ABO/RH(D): B POS
Antibody Screen: NEGATIVE

## 2019-12-23 NOTE — Progress Notes (Signed)
HD 27 Di/di twins 31.6 wga, cx shortening  No c/o; denies vb/lof/ctx; +FM x2 Mood better, looking forward to being 27 wk tomorrow and eval for d/c home at 28 wga  Patient Vitals for the past 24 hrs:  BP Temp Temp src Pulse Resp SpO2 Weight  12/23/19 0634 106/61 98 F (36.7 C) Oral (!) 111 18 100 % (!) 143 kg  12/22/19 1938 122/75 98.2 F (36.8 C) Oral (!) 112 18 100 % --  12/22/19 1805 120/70 98.5 F (36.9 C) Oral (!) 120 18 100 % --  12/22/19 1207 126/74 98.1 F (36.7 C) Oral (!) 106 18 99 % --  12/22/19 0903 109/62 98.2 F (36.8 C) Oral (!) 106 18 100 % --   A&ox3 nml respirations Abd: soft, nt, gravid LE: no edema, nt bilat  FHT: A: 130s, nml variability, +accels, no decels B: 140s, nml variability, +accels, no decels Toco:no ctx  A/P: di/di twins, 26.6 wga 1. Cervical shortening - prometrium q hs, stable 2.  Preterm, s/p bmz 3.  Fetal status reassuring x2; Plan repeat growth u/s 28 wga 4. GTT tomorrow 0700 5. Tdap/cbc/rpr at 28 wga

## 2019-12-23 NOTE — Plan of Care (Signed)
  Problem: Skin Integrity: Goal: Risk for impaired skin integrity will decrease Outcome: Progressing   Problem: Clinical Measurements: Goal: Ability to maintain clinical measurements within normal limits will improve Outcome: Progressing   Problem: Clinical Measurements: Goal: Complications related to the disease process, condition or treatment will be avoided or minimized Outcome: Progressing

## 2019-12-24 LAB — GLUCOSE TOLERANCE, 1 HOUR: Glucose, 1 Hour GTT: 125 mg/dL (ref 70–140)

## 2019-12-24 NOTE — Progress Notes (Signed)
Hospital day #27 Preterm cervical change 27w 0d DIDI twins  Subjective: Patient notes good fetal movement x2.  No leakage of fluid though more discharge after vaginal Prometrium.  No vaginal bleeding.  Patient is tolerating bedrest at this time.  She has  had a bowel movement.  She reports no chest pain or shortness of breath.  She reports no lower extremity swelling and has been wearing her SCDs. No change in DC or contractions noted.  Objective: Vitals:   12/23/19 1558 12/23/19 1930 12/23/19 2217 12/24/19 0557  BP: 106/81 129/66 108/61 117/61  Pulse: (!) 115 (!) 114 (!) 101 95  Resp: 20 20 20 18   Temp: 97.9 F (36.6 C) 98.2 F (36.8 C) 98.2 F (36.8 C) 98.2 F (36.8 C)  TempSrc: Oral Oral Oral Oral  SpO2: 99% 99% 100% 100%  Weight:    (!) 143 kg  Height:       General: Well-appearing, no distress NCAT Neck : supple with FROM Lungs: CTA CV: RRR Abdomen: Obese, gravid, no fundal tenderness, no right upper quadrant pain No CVAT Skin: intact Neuro: nonfocal GU: Deferred Lower extremity: Nontender, no edema  Toco: None NST: A: 150s, +10 x 10 accelerations, no decelerations, 5-8 beat variability, difficult to get prolonged tracing due to early gestational age and maternal obesity however fetal movement noticed by patient and heard during 1 hour of monitoring NST: B: 150s, +10 x 10 accelerations, no decelerations, 5-8 beat variability, difficult to get prolonged tracing due to early gestational age and maternal obesity however fetal movement noticed by patient and heard during 1 hour of monitoring  Reassuring FHR tracings x 3  CBC    Component Value Date/Time   WBC 9.8 11/26/2019 1215   RBC 4.37 11/26/2019 1215   HGB 11.5 (L) 11/26/2019 1215   HCT 35.7 (L) 11/26/2019 1215   PLT 285 11/26/2019 1215   MCV 81.7 11/26/2019 1215   MCH 26.3 11/26/2019 1215   MCHC 32.2 11/26/2019 1215   RDW 15.8 (H) 11/26/2019 1215   LYMPHSABS 0.9 11/26/2019 1215   MONOABS 0.6 11/26/2019 1215    EOSABS 0.1 11/26/2019 1215   BASOSABS 0.0 11/26/2019 1215   1gtt 125   Assessment plan:   27 w 0d DIDI twin IUP Preterm cervical change- no risk factors Reassuring fetal status BMZ series complete Vaginal prometrium VE and possible dc at 28wks.   Zamarion Longest J 12/24/2019 10:05 AM

## 2019-12-24 NOTE — Progress Notes (Signed)
Patient has been NPO during night, 50gm Glucola drank at 0600 for her 1 hour GTT. Patient tolerated well.

## 2019-12-25 NOTE — Progress Notes (Signed)
27 1/7 wk twin gestation Shortened cervix S: no complaint. Good FM BM today. Denies ctx  O; BP 111/69 (BP Location: Left Arm)   Pulse 96   Temp 98 F (36.7 C) (Oral)   Resp 18   Ht 5\' 11"  (1.803 m)   Wt (!) 143.3 kg   LMP 06/18/2019   SpO2 100%   BMI 44.07 kg/m  Lungs clear to A Cor RRR abd gravid non tender Pad none extr no edema or calf tenderness  Tracing: NST pending (+) FHR x 2 Nl 1hr GCT IMP: twin gestation @ 27 1/7 wk Shortened cervix P) cont inpt. NST today

## 2019-12-26 LAB — TYPE AND SCREEN
ABO/RH(D): B POS
Antibody Screen: NEGATIVE

## 2019-12-26 NOTE — Progress Notes (Signed)
27 2/7 wk twin gestation Shortened cervix  S: no complaint. Good FM Denies ctx/ VB/ spotting/ any change in vag dc   O; BP 110/72 (BP Location: Left Arm)   Pulse (!) 104   Temp 98 F (36.7 C) (Oral)   Resp 18   Ht 5\' 11"  (1.803 m)   Wt (!) 144.9 kg   LMP 06/18/2019   SpO2 100%   BMI 44.55 kg/m  Lungs clear to A Cor RRR abd gravid non tender extr no edema or calf tenderness  Tracing: NST reactive for A and B, baseline 150s, 10 beat variability, + accels, no decels  (+) FHR x 2  1hr Glucola- 125  CBC, RPR, TDAP at 28 wks   IMP: twin gestation @ 27 2/7 wk Shortened cervix P) cont inpt. NST today Growth sono and cx assessement at 28 wks on 3/24   V.Lorrene Graef MD

## 2019-12-26 NOTE — Progress Notes (Addendum)
For the last week day shift RNs have attempted to monitor the twins for 20-30 minutes each morning, with varying degrees of success.  This morning proved difficult to capture tracing for either fetus, but we were able to verify heart tones in normal range for each.  Maternal habitus and fetal positions/movements both impeded ability trace FHR continuously.  Phoned Dr. Juliene Pina and requested that she review today's tracing with RN.  After review, Dr. Juliene Pina said there was no need for further FHR monitoring today.  See clarified EFM order.

## 2019-12-27 NOTE — Progress Notes (Signed)
27 3/7 wk twin gestation Shortened cervix  S: no complaint. Good FM Denies ctx/ VB/ spotting/ any change in vag dc.  Daily Lovenox. Not wearing SCDs.   O; BP (!) 108/59 (BP Location: Left Arm)   Pulse (!) 103   Temp 98 F (36.7 C) (Oral)   Resp 18   Ht 5\' 11"  (1.803 m)   Wt (!) 144.9 kg   LMP 06/18/2019   SpO2 99%   BMI 44.55 kg/m  Lungs clear to A Cor RRR abd gravid non tender extr no edema or calf tenderness   (+) FHR x 2  1hr Glucola- 125  CBC, RPR, TDAP at 28 wks   IMP: twin gestation @ 27 3/7 wk Shortened cervix P) cont inpt.  Growth sono and cx assessement at 28 wks on 3/24   4/24 A Paula Cross 12/27/2019 11:06 AM

## 2019-12-27 NOTE — Plan of Care (Signed)
Patient remains on bedrest but has periods of sitting for a shower.Vital signs have remained stable and she continues to deny any cramping,leaking or bleeding.

## 2019-12-28 NOTE — Progress Notes (Signed)
27 4/7 wk twin gestation Shortened cervix  S: no complaint. Good FM.  Patient looking forward to sterile speculum exam tomorrow to get more information about cervical dilation.  Patient hopeful for ability to discharge to home on Wednesday.  Patient notes more back pain.  Feels like her abdomen is more full.  Patient denies contractions. Denies ctx/ VB/ spotting/ any change in vag dc.  Daily Lovenox. Not wearing SCDs.   O; BP 122/63 (BP Location: Left Arm)   Pulse (!) 113   Temp 97.9 F (36.6 C) (Oral)   Resp 18   Ht 5\' 11"  (1.803 m)   Wt (!) 144.9 kg   LMP 06/18/2019   SpO2 98%   BMI 44.55 kg/m  Lungs clear to A Cor RRR abd gravid non tender extr no edema or calf tenderness   (+) FHR x 2  1hr Glucola- 125  CBC, RPR, TDAP at 28 wks   IMP: twin gestation @ 27 4/7 wk Shortened cervix P) cont inpt.  Growth sono and cx assessement at 28 wks on 3/24   4/24 12/28/2019 1:26 PM

## 2019-12-29 LAB — TYPE AND SCREEN
ABO/RH(D): B POS
Antibody Screen: NEGATIVE

## 2019-12-29 NOTE — Progress Notes (Signed)
Hospital day #32 Preterm cervical change 27w 5d DIDI twins  Subjective: Patient notes good fetal movement x2.  No leakage of fluid.  No vaginal bleeding.  Patient is tolerating bedrest at this time.  She has  had a bowel movement.  She reports no chest pain or shortness of breath.  She reports no lower extremity swelling and has been wearing her SCDs. No change in DC or contractions noted.  Objective: Vitals:   12/28/19 1623 12/28/19 1934 12/28/19 2310 12/29/19 0516  BP: 105/62 128/78 121/69   Pulse: (!) 105 (!) 102 (!) 104   Resp: 18 15 16    Temp: 98.2 F (36.8 C) 98.5 F (36.9 C) 98.5 F (36.9 C)   TempSrc: Oral     SpO2: 100% 100% 100%   Weight:    (!) 143.4 kg  Height:       General: Well-appearing, no distress NCAT Neck : supple with FROM Lungs: CTA CV: RRR Abdomen: Obese, gravid, no fundal tenderness, no right upper quadrant pain No CVAT Skin: intact Neuro: nonfocal GU: Deferred Lower extremity: Nontender, no edema  Toco: None NST: A: 150s, +10 x 10 accelerations, no decelerations, 5-8 beat variability, difficult to get prolonged tracing due to early gestational age and maternal obesity however fetal movement noticed by patient and heard during 1 hour of monitoring NST: B: 150s, +10 x 10 accelerations, no decelerations, 5-8 beat variability, difficult to get prolonged tracing due to early gestational age and maternal obesity however fetal movement noticed by patient and heard during 1 hour of monitoring  Reassuring FHR tracings x 3  CBC    Component Value Date/Time   WBC 9.8 11/26/2019 1215   RBC 4.37 11/26/2019 1215   HGB 11.5 (L) 11/26/2019 1215   HCT 35.7 (L) 11/26/2019 1215   PLT 285 11/26/2019 1215   MCV 81.7 11/26/2019 1215   MCH 26.3 11/26/2019 1215   MCHC 32.2 11/26/2019 1215   RDW 15.8 (H) 11/26/2019 1215   LYMPHSABS 0.9 11/26/2019 1215   MONOABS 0.6 11/26/2019 1215   EOSABS 0.1 11/26/2019 1215   BASOSABS 0.0 11/26/2019 1215   1gtt 125    Assessment plan:   27 w 5d DIDI twin IUP Preterm cervical change- no risk factors Reassuring fetal status BMZ series complete Vaginal prometrium VE and possible dc at 28wks.   Annye Forrey J 12/29/2019 8:41 AM

## 2019-12-29 NOTE — Evaluation (Signed)
Physical Therapy Evaluation and Discharge Patient Details Name: Paula Cross MRN: [id number] DOB: Jan 01, 1991 Today's Date: 12/29/2019   History of Present Illness  29 year old G1 at 23 weeks and 0 days with di-ditwins and cervical incompetence. Pt on bed rest and now experiencing back and L buttock pain    Clinical Impression  Pt on bedrest and now beginning to experience lower back pain and L buttock pain. Educated pt/hand out provided on bed level stretching and exercises. Pt very appreciative and found relief in sidelying knee towards chest and figure stretch in supine. Pt's spouse to come tomorrow and she feels comfortable instructing him on how to assist her. Due to bed rest orders pt mobility not assessed however per pt and RN pt getting up on own to use the bathroom without difficulty. Pt with no further acute PT needs at this time. PT SIGNING OFF. Please re-consult if needed in future.    Follow Up Recommendations No PT follow up    Equipment Recommendations  None recommended by PT    Recommendations for Other Services       Precautions / Restrictions Precautions Precautions: Other (comment) Precaution Comments: bed rest, BR priveledges only      Mobility  Bed Mobility Overal bed mobility: Modified Independent                Transfers                 General transfer comment: not observed as pt on bedrest, per RN and pt, pt able to get up and go to bathroom by herself without difficulty  Ambulation/Gait             General Gait Details: not observed as pt on bed rest  Stairs            Wheelchair Mobility    Modified Rankin (Stroke Patients Only)       Balance Overall balance assessment: No apparent balance deficits (not formally assessed)                                           Pertinent Vitals/Pain Pain Assessment: 0-10 Pain Score: 4  Pain Location: L buttocks Pain Descriptors / Indicators:  Cramping Pain Intervention(s): Monitored during session    Home Living Family/patient expects to be discharged to:: Private residence Living Arrangements: Spouse/significant other Available Help at Discharge: Family;Available 24 hours/day Type of Home: House Home Access: Stairs to enter   [employer] of Steps: 3          Prior Function Level of Independence: Independent         Comments: now on bed rest     Hand Dominance   Dominant Hand: Right    Extremity/Trunk Assessment   Upper Extremity Assessment Upper Extremity Assessment: Overall WFL for tasks assessed    Lower Extremity Assessment Lower Extremity Assessment: LLE deficits/detail LLE Deficits / Details: experiencing cramping in piriformis area    Cervical / Trunk Assessment Cervical / Trunk Assessment: Other exceptions Cervical / Trunk Exceptions: pregnant with twins  Communication   Communication: No difficulties  Cognition Arousal/Alertness: Awake/alert Behavior During Therapy: WFL for tasks assessed/performed Overall Cognitive Status: Within Functional Limits for tasks assessed  General Comments General comments (skin integrity, edema, etc.): VSS    Exercises Other Exercises Other Exercises: pt given antenantal exercise program for mothers on bedrest. Pt with good understanding and expressed relief with sidelying knee to chest and figure four stretch in supine, instructed on how to have husband help her with figure four stretch in sitting when she can't tolerate lying on her back anymore   Assessment/Plan    PT Assessment Patent does not need any further PT services  PT Problem List         PT Treatment Interventions      PT Goals (Current goals can be found in the Care Plan section)  Acute Rehab PT Goals Patient Stated Goal: "keep these babies in" PT Goal Formulation: All assessment and education complete, DC therapy     Frequency     Barriers to discharge        Co-evaluation               AM-PAC PT "6 Clicks" Mobility  Outcome Measure Help needed turning from your back to your side while in a flat bed without using bedrails?: None Help needed moving from lying on your back to sitting on the side of a flat bed without using bedrails?: None Help needed moving to and from a bed to a chair (including a wheelchair)?: None Help needed standing up from a chair using your arms (e.g., wheelchair or bedside chair)?: None Help needed to walk in hospital room?: None Help needed climbing 3-5 steps with a railing? : A Little 6 Click Score: 23    End of Session   Activity Tolerance: Patient tolerated treatment well Patient left: in bed;with call bell/phone within reach Nurse Communication: Mobility status PT Visit Diagnosis: Pain Pain - Right/Left: Left Pain - part of body: (buttocks)    Time: 9833-8250 PT Time Calculation (min) (ACUTE ONLY): 20 min   Charges:   PT Evaluation $PT Eval Low Complexity: 1 Low          Kittie Plater, PT, DPT Acute Rehabilitation Services Pager #: [phone] Office #: [phone]   Berline Lopes 12/29/2019, 2:38 PM

## 2019-12-29 NOTE — Progress Notes (Cosign Needed)
    Durable Medical Equipment  (From admission, onward)         Start     Ordered   12/31/19 0810  For home use only DME 3 n 1  Once    Comments: HD   12/29/19 1611   12/29/19 1613  For home use only DME standard manual wheelchair with seat cushion  Once    Comments: Patient suffers from preterm cervical change,  which impairs their ability to perform daily activities like ADL's  in the home.  A walker  will not resolve issue with performing activities of daily living. A wheelchair will allow patient to safely perform daily activities. Patient can safely propel the wheelchair in the home or has a caregiver who can provide assistance. Length of need 99 months Accessories: elevating leg rests (ELRs), wheel locks, extensions and anti-tippers.  HD   12/29/19 1617        equipment

## 2019-12-29 NOTE — Progress Notes (Signed)
S: no bleeding, no cramping, +FM x 2.   O: Gen: no distress Abd: obese, gravid LE: NT, no edema  SSE: no blood, no fluid, no ROM, no visible membranes at os. cvx looks closed but soft and short. SVE: cvx 1cm/ 50% effaced/ soft/ mid/ fetal part palpated but no attempt to discern position.   A/P: Cervical incompetence, di-di twin, 27+ wks. Cont bed rest, prometrium. Repeat u/s for growth/ position and CL via TV at 28 wks. Given exam today, pt likley a candidate to d/c to home for home bed rest at 28 wks.  D/c planning begun. Will need wheelchair/ bedside commode, Lovenox. Final decision at 28 wks.   Lendon Colonel 12/29/2019 10:14 PM

## 2019-12-29 NOTE — Care Management Note (Signed)
Case Management Note  Patient Details  Name: Paula Cross MRN: [id number] Date of Birth: 1991-08-18  Subjective/Objective:                  27 4/7 wk twin gestation Shortened cervix   Action/Plan: Wheelchair and 3 N 1    Discharge planning Services  CM Consult   DME Arranged:  3-N-1, Government social research officer DME Agency:  [employer]       Status of Service:  Completed, signed off    Additional Comments: CM received call from charge RN that patient needs 3 n1 and and wheelchair for discharge.  CM spoke to MD- Dr. Ernestina Penna and confirmed with MD that plan was to discharge on Wednesday with equipment listed above.  CM called [employer] and spoke to [facility] dme rep,  [phone] and gave referral to him.  Orders in and he confirmed referral and plans to deliver equipment to patient's room prior to discharge from hospital.  Plan for discharge at this time is Wednesday.  No other needs noted at this time.    Gretchen Short RNC-MNN, BSN Transitions of Care [facility]

## 2019-12-30 NOTE — Progress Notes (Signed)
Hd #33 Preterm cervical change 27.6wga  No c/o; resting in bed Denies lof/vb; +fm x2; hoping for d/c home tomorrow pending u/s  Patient Vitals for the past 24 hrs:  BP Temp Temp src Pulse Resp SpO2 Weight  12/30/19 1253 (!) 120/55 98.1 F (36.7 C) Oral (!) 107 18 100 % --  12/30/19 0912 124/67 97.9 F (36.6 C) Oral 97 18 99 % --  12/30/19 0649 127/72 98 F (36.7 C) -- 99 18 99 % (!) 142.9 kg  12/29/19 1921 132/72 98.1 F (36.7 C) Oral (!) 106 18 99 % --  12/29/19 1920 -- -- -- -- -- 100 % --  12/29/19 1552 124/76 98 F (36.7 C) Oral (!) 111 18 100 % --   A&ox3 rrr ctab Abd: soft, nt, gravid Le; no edema, nt bilat  Fht: difficulty to trace for 1hr continuously A: 140s, nml variaibility, +accels, no decels; reassuring for gestational age B: 140s, nml variability, +accels, no decels; reassuring for gestational age Toco: none  A/P: di/di twins, preterm cervical dilation/change 1. Stable; cx exam done yesterday, 1/50% with membranes noted at os during sse; management planning tomorrow; growth u/s tomorrow 2. Preterm: s/p bmz, s/p nicu consult 3. dvt proph: lovenox 4. reassuring fetal status 5. Vaginal prometrium q hs 6. Passed 1hr gtt, 125

## 2019-12-31 ENCOUNTER — Inpatient Hospital Stay (HOSPITAL_BASED_OUTPATIENT_CLINIC_OR_DEPARTMENT_OTHER): Payer: BC Managed Care – PPO

## 2019-12-31 DIAGNOSIS — O99213 Obesity complicating pregnancy, third trimester: Secondary | ICD-10-CM

## 2019-12-31 DIAGNOSIS — Z362 Encounter for other antenatal screening follow-up: Secondary | ICD-10-CM | POA: Diagnosis not present

## 2019-12-31 DIAGNOSIS — Z364 Encounter for antenatal screening for fetal growth retardation: Secondary | ICD-10-CM | POA: Diagnosis not present

## 2019-12-31 DIAGNOSIS — O30043 Twin pregnancy, dichorionic/diamniotic, third trimester: Secondary | ICD-10-CM | POA: Diagnosis not present

## 2019-12-31 DIAGNOSIS — Z3A28 28 weeks gestation of pregnancy: Secondary | ICD-10-CM

## 2019-12-31 LAB — CBC
HCT: 34.9 % — ABNORMAL LOW (ref 36.0–46.0)
Hemoglobin: 11.2 g/dL — ABNORMAL LOW (ref 12.0–15.0)
MCH: 26.4 pg (ref 26.0–34.0)
MCHC: 32.1 g/dL (ref 30.0–36.0)
MCV: 82.3 fL (ref 80.0–100.0)
Platelets: 270 10*3/uL (ref 150–400)
RBC: 4.24 MIL/uL (ref 3.87–5.11)
RDW: 16.5 % — ABNORMAL HIGH (ref 11.5–15.5)
WBC: 8.3 10*3/uL (ref 4.0–10.5)
nRBC: 0 % (ref 0.0–0.2)

## 2019-12-31 LAB — RPR: RPR Ser Ql: NONREACTIVE

## 2019-12-31 MED ORDER — CALCIUM CARBONATE ANTACID 500 MG PO CHEW: 2.0000 | CHEWABLE_TABLET | ORAL | 1 refills | Status: AC | PRN

## 2019-12-31 MED ORDER — ENOXAPARIN (LOVENOX) PATIENT EDUCATION KIT
PACK | Freq: Once | Status: DC
  Filled 2019-12-31: qty 1

## 2019-12-31 MED ORDER — PROGESTERONE MICRONIZED 200 MG PO CAPS: 200.0000 mg | ORAL_CAPSULE | Freq: Every day | ORAL | 1 refills | Status: DC

## 2019-12-31 MED ORDER — PRENATAL MULTIVITAMIN CH: 1.0000 | ORAL_TABLET | Freq: Every day | ORAL | 1 refills | Status: AC

## 2019-12-31 MED ORDER — POLYETHYLENE GLYCOL 3350 17 G PO PACK: 17.0000 g | PACK | Freq: Every day | ORAL | 1 refills | Status: AC | PRN

## 2019-12-31 MED ORDER — ENOXAPARIN SODIUM 80 MG/0.8ML ~~LOC~~ SOLN: 70.0000 mg | Freq: Every day | SUBCUTANEOUS | 0 refills | Status: DC

## 2019-12-31 MED ORDER — ACETAMINOPHEN 325 MG PO TABS: 650.0000 mg | ORAL_TABLET | ORAL | 1 refills | Status: AC | PRN

## 2019-12-31 MED ORDER — DOCUSATE SODIUM 100 MG PO CAPS: 100.0000 mg | ORAL_CAPSULE | Freq: Two times a day (BID) | ORAL | 3 refills | Status: AC

## 2019-12-31 MED ORDER — FAMOTIDINE 20 MG PO TABS: 20.0000 mg | ORAL_TABLET | Freq: Two times a day (BID) | ORAL | 1 refills | Status: DC

## 2019-12-31 NOTE — Progress Notes (Signed)
Attempted to replace babies on monitor for further FHR monitoring.  Mother declined @ this time.

## 2020-01-01 NOTE — Discharge Summary (Signed)
Patient ID: Paula Cross MRN: [id number] DOB/AGE: Dec 15, 1990 29 y.o.  Admit date: 11/26/2019 Discharge date: 12/31/19  Admission Diagnoses: Cervical incompetence [N88.3]  Discharge Diagnoses: Cervical incompetence [N88.3]         Discharged Condition: stable  Hospital Course: Patient admitted with cervical incompetence at 23+ week in a di-ditwin pregnancy.  She had no evidence of contractions and reactive fetal testing.  She had MFM consult in NICU consultation.  She received betamethasone.  Consideration was given to rescue cervical cerclage but given the lack of data in a twin pregnancy patient was continued on vaginal progesterone instead.  Vaginal progesterone started 1 week prior to admission when for signs of cervical change was noted.  Patient remained on bedrest with bathroom privileges and over time advanced to once daily wheelchair privileges.  She had daily fetal heart tones and intermittent ultrasounds and nonstress test.  Patient passed her diabetic screen.  Just prior to 28 weeks patient had repeat sterile speculum exam and sterile vaginal exam which revealed 1 cm dilated, soft cervix with about 1 cm cervical length.  She at 28 weeks had a Tdap, negative RPR and normal CBC.  Repeat ultrasound was done at 28 weeks with vertex vertex presentation, no significant change of the cervical length, and appropriate fetal growth.  Decision was made to discharge the patient to home for strict bedrest.  She was continued on her Lovenox given her obesity and her plan for bedrest.  Assistance was given to the patient in the form of a shower chair, bedside commode and wheelchair.  Patient will follow up with [facility] in 2 weeks time or sooner should she develop contractions or leakage of fluid.  Patient is aware she is still a candidate for rescue dose steroids should she have further concern for preterm delivery.  Consults: MFM  Treatments: anticoagulation: LMW heparin, steroids:  Betamethasone x2 doses and bedrest  Disposition: home   Allergies as of 12/31/2019   No Known Allergies     Medication List    STOP taking these medications   progesterone 200 MG Supp     TAKE these medications   acetaminophen 325 MG tablet Commonly known as: TYLENOL Take 2 tablets (650 mg total) by mouth every 4 (four) hours as needed (for pain scale < 4  OR  temperature  >/=  100.5 F).   calcium carbonate 500 MG chewable tablet Commonly known as: TUMS - dosed in mg elemental calcium Chew 2 tablets (400 mg of elemental calcium total) by mouth every 4 (four) hours as needed for indigestion. What changed:   how much to take  when to take this  reasons to take this   docusate sodium 100 MG capsule Commonly known as: COLACE Take 1 capsule (100 mg total) by mouth 2 (two) times daily.   enoxaparin 80 MG/0.8ML injection Commonly known as: LOVENOX Inject 0.7 mLs (70 mg total) into the skin daily.   famotidine 20 MG tablet Commonly known as: PEPCID Take 1 tablet (20 mg total) by mouth 2 (two) times daily.   polyethylene glycol 17 g packet Commonly known as: MIRALAX / GLYCOLAX Take 17 g by mouth daily as needed for moderate constipation.   prenatal multivitamin Tabs tablet Take 1 tablet by mouth daily at 12 noon. What changed: when to take this   progesterone 200 MG capsule Commonly known as: PROMETRIUM Place 1 capsule (200 mg total) vaginally at bedtime.        Signed: Ala Dach, MD  MD 01/01/2020, 4:45 PM

## 2020-02-20 LAB — OB RESULTS CONSOLE GBS: GBS: NEGATIVE

## 2020-02-23 ENCOUNTER — Encounter (HOSPITAL_COMMUNITY): Payer: Self-pay | Admitting: Obstetrics

## 2020-02-23 ENCOUNTER — Other Ambulatory Visit: Payer: Self-pay

## 2020-02-23 ENCOUNTER — Inpatient Hospital Stay (EMERGENCY_DEPARTMENT_HOSPITAL)
Admission: AD | Admit: 2020-02-23 | Discharge: 2020-02-23 | Disposition: A | Discharge status: Home / Self Care | Payer: BC Managed Care – PPO | Source: Home / Self Care | Attending: Obstetrics | Admitting: Obstetrics

## 2020-02-23 DIAGNOSIS — O30043 Twin pregnancy, dichorionic/diamniotic, third trimester: Secondary | ICD-10-CM

## 2020-02-23 DIAGNOSIS — O4703 False labor before 37 completed weeks of gestation, third trimester: Secondary | ICD-10-CM | POA: Diagnosis not present

## 2020-02-23 DIAGNOSIS — O1414 Severe pre-eclampsia complicating childbirth: Secondary | ICD-10-CM | POA: Diagnosis not present

## 2020-02-23 DIAGNOSIS — O1413 Severe pre-eclampsia, third trimester: Secondary | ICD-10-CM | POA: Diagnosis not present

## 2020-02-23 DIAGNOSIS — Z3A35 35 weeks gestation of pregnancy: Secondary | ICD-10-CM | POA: Insufficient documentation

## 2020-02-23 DIAGNOSIS — Z3A36 36 weeks gestation of pregnancy: Secondary | ICD-10-CM | POA: Diagnosis not present

## 2020-02-23 DIAGNOSIS — Z0371 Encounter for suspected problem with amniotic cavity and membrane ruled out: Secondary | ICD-10-CM | POA: Diagnosis not present

## 2020-02-23 DIAGNOSIS — R519 Headache, unspecified: Secondary | ICD-10-CM | POA: Diagnosis not present

## 2020-02-23 HISTORY — DX: Other specified health status: Z78.9

## 2020-02-23 LAB — COMPREHENSIVE METABOLIC PANEL
ALT: 18 U/L (ref 0–44)
AST: 17 U/L (ref 15–41)
Albumin: 2.6 g/dL — ABNORMAL LOW (ref 3.5–5.0)
Alkaline Phosphatase: 189 U/L — ABNORMAL HIGH (ref 38–126)
Anion gap: 10 (ref 5–15)
BUN: <5 mg/dL — ABNORMAL LOW (ref 6–20)
CO2: 21 mmol/L — ABNORMAL LOW (ref 22–32)
Calcium: 9.2 mg/dL (ref 8.9–10.3)
Chloride: 109 mmol/L (ref 98–111)
Creatinine, Ser: 0.8 mg/dL (ref 0.44–1.00)
GFR calc Af Amer: >60 mL/min (ref >60)
GFR calc non Af Amer: >60 mL/min (ref >60)
Glucose, Bld: 105 mg/dL — ABNORMAL HIGH (ref 70–99)
Potassium: 3.2 mmol/L — ABNORMAL LOW (ref 3.5–5.1)
Sodium: 140 mmol/L (ref 135–145)
Total Bilirubin: 0.3 mg/dL (ref 0.3–1.2)
Total Protein: 6.7 g/dL (ref 6.5–8.1)

## 2020-02-23 LAB — CBC WITH DIFFERENTIAL/PLATELET
Abs Immature Granulocytes: 0.04 10*3/uL (ref 0.00–0.07)
Basophils Absolute: 0 10*3/uL (ref 0.0–0.1)
Basophils Relative: 0 %
Eosinophils Absolute: 0 10*3/uL (ref 0.0–0.5)
Eosinophils Relative: 0 %
HCT: 37.6 % (ref 36.0–46.0)
Hemoglobin: 11.5 g/dL — ABNORMAL LOW (ref 12.0–15.0)
Immature Granulocytes: 0 %
Lymphocytes Relative: 10 %
Lymphs Abs: 1.1 10*3/uL (ref 0.7–4.0)
MCH: 24.4 pg — ABNORMAL LOW (ref 26.0–34.0)
MCHC: 30.6 g/dL (ref 30.0–36.0)
MCV: 79.7 fL — ABNORMAL LOW (ref 80.0–100.0)
Monocytes Absolute: 0.8 10*3/uL (ref 0.1–1.0)
Monocytes Relative: 7 %
Neutro Abs: 8.9 10*3/uL — ABNORMAL HIGH (ref 1.7–7.7)
Neutrophils Relative %: 83 %
Platelets: 193 10*3/uL (ref 150–400)
RBC: 4.72 MIL/uL (ref 3.87–5.11)
RDW: 19.4 % — ABNORMAL HIGH (ref 11.5–15.5)
WBC: 10.8 10*3/uL — ABNORMAL HIGH (ref 4.0–10.5)
nRBC: 0 % (ref 0.0–0.2)

## 2020-02-23 LAB — URINALYSIS, ROUTINE W REFLEX MICROSCOPIC
Bilirubin Urine: NEGATIVE
Glucose, UA: NEGATIVE mg/dL
Hgb urine dipstick: NEGATIVE
Ketones, ur: NEGATIVE mg/dL
Nitrite: NEGATIVE
Protein, ur: NEGATIVE mg/dL
Specific Gravity, Urine: 1.01 (ref 1.005–1.030)
pH: 7 (ref 5.0–8.0)

## 2020-02-23 LAB — PROTEIN / CREATININE RATIO, URINE
Creatinine, Urine: 110.69 mg/dL
Protein Creatinine Ratio: 0.22 mg/mg{Cre} — ABNORMAL HIGH (ref 0.00–0.15)
Total Protein, Urine: 24 mg/dL

## 2020-02-23 MED ORDER — CYCLOBENZAPRINE HCL 5 MG PO TABS
10.0000 mg | ORAL_TABLET | Freq: Once | ORAL | Status: AC
  Administered 2020-02-23: 10 mg via ORAL
  Filled 2020-02-23: qty 2

## 2020-02-23 MED ORDER — LACTATED RINGERS IV BOLUS
1000.0000 mL | Freq: Once | INTRAVENOUS | Status: AC
  Administered 2020-02-23: 1000 mL via INTRAVENOUS

## 2020-02-23 MED ORDER — NALBUPHINE HCL 10 MG/ML IJ SOLN
10.0000 mg | Freq: Once | INTRAMUSCULAR | Status: AC
  Administered 2020-02-23: 10 mg via INTRAVENOUS
  Filled 2020-02-23: qty 1

## 2020-02-23 MED ORDER — ONDANSETRON HCL 4 MG/2ML IJ SOLN
4.0000 mg | Freq: Once | INTRAMUSCULAR | Status: AC
  Administered 2020-02-23: 4 mg via INTRAVENOUS
  Filled 2020-02-23: qty 2

## 2020-02-23 NOTE — MAU Provider Note (Addendum)
History     CSN: [id number]  Arrival date and time: 02/23/20 4970   First Provider Initiated Contact with Patient 02/23/20 [phone]       Chief Complaint  Patient presents with  . Abdominal Pain  . Contractions   Paula Cross is a 29 y.o. G1P0 at [redacted]w[redacted]d with DiDi twins who presents to MAU with complaints of abdominal pain, nausea/vomiting, and low grade temp of 99.8 at home. Patient reports that abdominal pain has been occurring for the past day. Describes two different abdominal pains, first abdominal pain is specific to her RLQ that is sharp and stabbing in nature, rates pain 8/10 - has taken Tylenol around 0200 for abdominal pain without relief. Patient reports the second abdominal pain is uterine tightening and contractions. Patient states that she did not time contractions, "but lives 12 minutes away and had 2 on the way to MAU". Rates contractions 5/10. Reports contractions started today around MN prior to having the onset of emesis. Patient reports having 3 occurrences of emesis this morning, last occurrence being around 0400. She reports taking her temp this morning because she "just was not feeling good and temp was 99.8 which is concerning for me because I usually run 96-97". She denies diarrhea or being around any sick contacts. Patient denies vaginal bleeding or LOF. Patient reports +FM x2. Reports that recent cervical examination on Friday in the office was 3cm.    OB History    Gravida  1   Para      Term      Preterm      AB      Living        SAB      TAB      Ectopic      Multiple      Live Births              Past Medical History:  Diagnosis Date  . Medical history non-contributory     Past Surgical History:  Procedure Laterality Date  . NO PAST SURGERIES      History reviewed. No pertinent family history.  Social History   Tobacco Use  . Smoking status: Never Smoker  . Smokeless tobacco: Never Used  Substance Use Topics  . Alcohol  use: Never  . Drug use: Never    Allergies: No Known Allergies  Medications Prior to Admission  Medication Sig Dispense Refill Last Dose  . acetaminophen (TYLENOL) 325 MG tablet Take 2 tablets (650 mg total) by mouth every 4 (four) hours as needed (for pain scale < 4  OR  temperature  >/=  100.5 F). 30 tablet 1 02/23/2020 at Unknown time  . calcium carbonate (TUMS - DOSED IN MG ELEMENTAL CALCIUM) 500 MG chewable tablet Chew 2 tablets (400 mg of elemental calcium total) by mouth every 4 (four) hours as needed for indigestion. 90 tablet 1 02/23/2020 at Unknown time  . docusate sodium (COLACE) 100 MG capsule Take 100 mg by mouth 2 (two) times daily.   02/22/2020 at Unknown time  . enoxaparin (LOVENOX) 80 MG/0.8ML injection Inject 0.7 mLs (70 mg total) into the skin daily. 210 mL 0 Past Week at Unknown time  . famotidine (PEPCID) 10 MG tablet Take 10 mg by mouth 2 (two) times daily.   02/22/2020 at Unknown time  . Ferrous Sulfate (IRON PO) Take by mouth.   02/22/2020 at Unknown time  . Prenatal Vit-Fe Fumarate-FA (PRENATAL MULTIVITAMIN) TABS tablet Take 1 tablet by mouth  daily at 12 noon. 30 tablet 1 02/22/2020 at Unknown time  . progesterone (PROMETRIUM) 200 MG capsule Place 1 capsule (200 mg total) vaginally at bedtime. 30 capsule 1 02/22/2020 at Unknown time  . famotidine (PEPCID) 20 MG tablet Take 1 tablet (20 mg total) by mouth 2 (two) times daily. 60 tablet 1     Review of Systems  Constitutional: Positive for fever. Negative for chills.       99.8 at home prior to taking Tylenol  Respiratory: Negative.   Cardiovascular: Negative.   Gastrointestinal: Positive for abdominal pain, nausea and vomiting. Negative for constipation and diarrhea.  Genitourinary: Negative.   Musculoskeletal: Positive for back pain.  Neurological: Negative.   Psychiatric/Behavioral: Negative.    Physical Exam   Pulse (!) 106, temperature 98.8 F (37.1 C), temperature source Oral, resp. rate 17, last menstrual  period 06/18/2019.  Physical Exam  Nursing note and vitals reviewed. Constitutional: She is oriented to person, place, and time. She appears well-developed and well-nourished. No distress.  Cardiovascular: Normal rate and regular rhythm.  Respiratory: Effort normal and breath sounds normal. No respiratory distress. She has no wheezes.  GI: Soft. There is abdominal tenderness. There is no rebound and no guarding.  Gravid. Tenderness with palpation in right flank. No RLQ tenderness or rebound. No RUQ pain.   Musculoskeletal:        General: Edema present. Normal range of motion.     Comments: +2 pitting edema bilaterally   Neurological: She is alert and oriented to person, place, and time. She displays normal reflexes. She exhibits normal muscle tone.  Skin: Skin is warm and dry.  Psychiatric: She has a normal mood and affect. Her behavior is normal. Thought content normal.   Initial cervical examination @ 0620 Dilation: 4 Effacement (%): 80 Station: -1 Exam by:: Steward Drone, CNM  NST: [id number] - [id number] FHR A (blue): 155/moderate/+accels/ no decelerations  FHR B (yellow): 140/moderate/+accels/ no decelerations Toco: UC every 2-4 minutes, mild by palpation   MAU Course  Procedures  MDM Orders Placed This Encounter  Procedures  . Urinalysis, Routine w reflex microscopic  . CBC with Differential/Platelet  . Comprehensive metabolic panel  . Protein / creatinine ratio, urine  . Measure blood pressure  . Insert peripheral IV   Meds ordered this encounter  Medications  . ondansetron (ZOFRAN) injection 4 mg  . lactated ringers bolus 1,000 mL  . cyclobenzaprine (FLEXERIL) tablet 10 mg   Treatments in MAU included LR bolus, zofran IV and PO flexeril - medication given @ [phone] Labs pending at this time   Reassessment @ 0745- patient reports pain is worse and she is unable to get comfortable, 10mg  IV nubain ordered.  Plan to recheck cervix around 0830.   UA, CMP and CBC with diff  reviewed:  Results for orders placed or performed during the hospital encounter of 02/23/20 (from the past 24 hour(s))  CBC with Differential/Platelet     Status: Abnormal   Collection Time: 02/23/20  6:44 AM  Result Value Ref Range   WBC 10.8 (H) 4.0 - 10.5 K/uL   RBC 4.72 3.87 - 5.11 MIL/uL   Hemoglobin 11.5 (L) 12.0 - 15.0 g/dL   HCT 02/25/20 28.3 - 15.1 %   MCV 79.7 (L) 80.0 - 100.0 fL   MCH 24.4 (L) 26.0 - 34.0 pg   MCHC 30.6 30.0 - 36.0 g/dL   RDW 76.1 (H) 60.7 - 37.1 %   Platelets 193 150 - 400 K/uL  nRBC 0.0 0.0 - 0.2 %   Neutrophils Relative % 83 %   Neutro Abs 8.9 (H) 1.7 - 7.7 K/uL   Lymphocytes Relative 10 %   Lymphs Abs 1.1 0.7 - 4.0 K/uL   Monocytes Relative 7 %   Monocytes Absolute 0.8 0.1 - 1.0 K/uL   Eosinophils Relative 0 %   Eosinophils Absolute 0.0 0.0 - 0.5 K/uL   Basophils Relative 0 %   Basophils Absolute 0.0 0.0 - 0.1 K/uL   Immature Granulocytes 0 %   Abs Immature Granulocytes 0.04 0.00 - 0.07 K/uL  Comprehensive metabolic panel     Status: Abnormal   Collection Time: 02/23/20  6:44 AM  Result Value Ref Range   Sodium 140 135 - 145 mmol/L   Potassium 3.2 (L) 3.5 - 5.1 mmol/L   Chloride 109 98 - 111 mmol/L   CO2 21 (L) 22 - 32 mmol/L   Glucose, Bld 105 (H) 70 - 99 mg/dL   BUN <5 (L) 6 - 20 mg/dL   Creatinine, Ser 0.80 0.44 - 1.00 mg/dL   Calcium 9.2 8.9 - 10.3 mg/dL   Total Protein 6.7 6.5 - 8.1 g/dL   Albumin 2.6 (L) 3.5 - 5.0 g/dL   AST 17 15 - 41 U/L   ALT 18 0 - 44 U/L   Alkaline Phosphatase 189 (H) 38 - 126 U/L   Total Bilirubin 0.3 0.3 - 1.2 mg/dL   GFR calc non Af Amer >60 >60 mL/min   GFR calc Af Amer >60 >60 mL/min   Anion gap 10 5 - 15  Urinalysis, Routine w reflex microscopic     Status: Abnormal   Collection Time: 02/23/20  6:57 AM  Result Value Ref Range   Color, Urine YELLOW YELLOW   APPearance HAZY (A) CLEAR   Specific Gravity, Urine 1.010 1.005 - 1.030   pH 7.0 5.0 - 8.0   Glucose, UA NEGATIVE NEGATIVE mg/dL   Hgb urine  dipstick NEGATIVE NEGATIVE   Bilirubin Urine NEGATIVE NEGATIVE   Ketones, ur NEGATIVE NEGATIVE mg/dL   Protein, ur NEGATIVE NEGATIVE mg/dL   Nitrite NEGATIVE NEGATIVE   Leukocytes,Ua SMALL (A) NEGATIVE   RBC / HPF 0-5 0 - 5 RBC/hpf   WBC, UA 6-10 0 - 5 WBC/hpf   Bacteria, UA FEW (A) NONE SEEN   Squamous Epithelial / LPF 11-20 0 - 5   Care taken over by [facility] [place], CNM 02/23/20, 8:08 AM  Reassessment @ 0950: Dilation: 4 Effacement (%): 80 Cervical Position: Anterior Station: -1 Presentation: Vertex Exam by:: Sunday Corn, CNM   *Consult with Dr. Elly Modena @ 1014 - notified of patient's complaints, assessments, lab & NST results, recommended tx plan d/c home & F/U with [facility] as scheduled  Dr. Pamala Hurry on unit @ 1030 inquiring about plan of care for patient, but no desire to assume care to change the plan of care. Dr. Pamala Hurry into to speak to patient.  Assessment and Plan  Preterm uterine contractions in third trimester, antepartum - Plan: Discharge patient - Reactive NST x2 - Advised to continue PTL regimen - Discussed signs and symptoms to return to MAU for  - All questions answered  - Advised to call [facility] for any additional problems - Keep scheduled appt 02/25/20 - Patient verbalized an understanding of the plan of care and agrees.    Laury Deep, CNM  02/23/2020 10:44 AM

## 2020-02-23 NOTE — MAU Note (Signed)
Pt reports to MAU c/o LRQ pain that started yesterday. Pt reports the pain feels as if she was punched. Pt states the pain is always there and it is a 8/10. Pt states she has also been experiencing some ctx that are on and off that she rates a 5/10. Pt states at home she took her temp and it was 99.6 (pt reports she normally runs 96) pt states she has vomited 4x. Pt denies bleeding but is unsure if her water is broke because when she vomited she felt like everything came out. +FM x 2.

## 2020-02-24 ENCOUNTER — Inpatient Hospital Stay (EMERGENCY_DEPARTMENT_HOSPITAL)
Admission: AD | Admit: 2020-02-24 | Discharge: 2020-02-24 | Disposition: A | Discharge status: Home / Self Care | Payer: BC Managed Care – PPO | Source: Home / Self Care | Attending: Obstetrics | Admitting: Obstetrics

## 2020-02-24 ENCOUNTER — Other Ambulatory Visit: Payer: Self-pay

## 2020-02-24 ENCOUNTER — Encounter (HOSPITAL_COMMUNITY): Payer: Self-pay | Admitting: Obstetrics

## 2020-02-24 ENCOUNTER — Other Ambulatory Visit: Payer: Self-pay | Admitting: Obstetrics and Gynecology

## 2020-02-24 DIAGNOSIS — O288 Other abnormal findings on antenatal screening of mother: Secondary | ICD-10-CM

## 2020-02-24 DIAGNOSIS — O30043 Twin pregnancy, dichorionic/diamniotic, third trimester: Secondary | ICD-10-CM

## 2020-02-24 DIAGNOSIS — Z79899 Other long term (current) drug therapy: Secondary | ICD-10-CM | POA: Insufficient documentation

## 2020-02-24 DIAGNOSIS — Z7901 Long term (current) use of anticoagulants: Secondary | ICD-10-CM | POA: Insufficient documentation

## 2020-02-24 DIAGNOSIS — Z3A35 35 weeks gestation of pregnancy: Secondary | ICD-10-CM

## 2020-02-24 DIAGNOSIS — O4703 False labor before 37 completed weeks of gestation, third trimester: Secondary | ICD-10-CM

## 2020-02-24 DIAGNOSIS — R102 Pelvic and perineal pain: Secondary | ICD-10-CM | POA: Insufficient documentation

## 2020-02-24 DIAGNOSIS — O26893 Other specified pregnancy related conditions, third trimester: Secondary | ICD-10-CM | POA: Insufficient documentation

## 2020-02-24 DIAGNOSIS — R030 Elevated blood-pressure reading, without diagnosis of hypertension: Secondary | ICD-10-CM | POA: Insufficient documentation

## 2020-02-24 LAB — CBC
HCT: 37 % (ref 36.0–46.0)
Hemoglobin: 11.3 g/dL — ABNORMAL LOW (ref 12.0–15.0)
MCH: 24.5 pg — ABNORMAL LOW (ref 26.0–34.0)
MCHC: 30.5 g/dL (ref 30.0–36.0)
MCV: 80.1 fL (ref 80.0–100.0)
Platelets: 185 10*3/uL (ref 150–400)
RBC: 4.62 MIL/uL (ref 3.87–5.11)
RDW: 19.6 % — ABNORMAL HIGH (ref 11.5–15.5)
WBC: 10.5 10*3/uL (ref 4.0–10.5)
nRBC: 0 % (ref 0.0–0.2)

## 2020-02-24 LAB — COMPREHENSIVE METABOLIC PANEL
ALT: 21 U/L (ref 0–44)
AST: 23 U/L (ref 15–41)
Albumin: 2.8 g/dL — ABNORMAL LOW (ref 3.5–5.0)
Alkaline Phosphatase: 192 U/L — ABNORMAL HIGH (ref 38–126)
Anion gap: 12 (ref 5–15)
BUN: <5 mg/dL — ABNORMAL LOW (ref 6–20)
CO2: 21 mmol/L — ABNORMAL LOW (ref 22–32)
Calcium: 9 mg/dL (ref 8.9–10.3)
Chloride: 106 mmol/L (ref 98–111)
Creatinine, Ser: 0.88 mg/dL (ref 0.44–1.00)
GFR calc Af Amer: >60 mL/min (ref >60)
GFR calc non Af Amer: >60 mL/min (ref >60)
Glucose, Bld: 102 mg/dL — ABNORMAL HIGH (ref 70–99)
Potassium: 3.4 mmol/L — ABNORMAL LOW (ref 3.5–5.1)
Sodium: 139 mmol/L (ref 135–145)
Total Bilirubin: 0.6 mg/dL (ref 0.3–1.2)
Total Protein: 7.2 g/dL (ref 6.5–8.1)

## 2020-02-24 LAB — PROTEIN / CREATININE RATIO, URINE
Creatinine, Urine: 86.17 mg/dL
Protein Creatinine Ratio: 0.17 mg/mg{Cre} — ABNORMAL HIGH (ref 0.00–0.15)
Total Protein, Urine: 15 mg/dL

## 2020-02-24 MED ORDER — LACTATED RINGERS IV BOLUS
1000.0000 mL | Freq: Once | INTRAVENOUS | Status: AC
  Administered 2020-02-24: 1000 mL via INTRAVENOUS

## 2020-02-24 MED ORDER — LACTATED RINGERS IV BOLUS
500.0000 mL | Freq: Once | INTRAVENOUS | Status: AC
  Administered 2020-02-24: 500 mL via INTRAVENOUS

## 2020-02-24 MED ORDER — NALBUPHINE HCL 10 MG/ML IJ SOLN
10.0000 mg | INTRAMUSCULAR | Status: DC | PRN
  Administered 2020-02-24 (×2): 10 mg via INTRAVENOUS
  Filled 2020-02-24 (×2): qty 1

## 2020-02-24 NOTE — Addendum Note (Signed)
Addended by: Catalina Antigua on: 02/24/2020 12:28 PM   Modules accepted: Orders

## 2020-02-24 NOTE — MAU Note (Signed)
.   Paula Cross is a 29 y.o. at [redacted]w[redacted]d here in MAU reporting: contractions that have gotten worse over night. Denies any VB or LOF LMP:  Onset of complaint:ongoing Pain score: 8 Vitals:   02/24/20 0901 02/24/20 0904  BP:  (!) 150/96  Pulse: (!) 104 (!) 107  Resp: 16   Temp: 98.9 F (37.2 C)   SpO2: 100%      FHT: baby A 187  Baby B 145 Lab orders placed from triage: UA

## 2020-02-24 NOTE — MAU Provider Note (Signed)
History     CSN: [id number]  Arrival date and time: 02/24/20 0845   First Provider Initiated Contact with Patient 02/24/20 [phone]      Chief Complaint  Patient presents with  . Contractions   HPI Paula Cross is a 29 y.o. G1P0 at [redacted]w[redacted]d with di/di twin gestation who presents to MAU with chief complaint of RLQ pain. This is a recurrent problem for which patient was evaluated in MAU yesterday. She endorses being pain free at the time of MAU discharge,  s/p IV Nubain. She states her pelvic pain returned about two hours after discharge and continued overnight. On arrival to MAU she rates her pain as 6/10 at rest, pain score increases with movement. She has not taken medication or tried other treatments for her pain.  She denies vaginal bleeding, leaking of fluid, decreased fetal movement, fever, falls, or recent illness.   She receives care with [facility] and her next appointment is tomorrow 02/25/2020.  OB History    Gravida  1   Para      Term      Preterm      AB      Living        SAB      TAB      Ectopic      Multiple      Live Births              Past Medical History:  Diagnosis Date  . Medical history non-contributory     Past Surgical History:  Procedure Laterality Date  . NO PAST SURGERIES      History reviewed. No pertinent family history.  Social History   Tobacco Use  . Smoking status: Never Smoker  . Smokeless tobacco: Never Used  Substance Use Topics  . Alcohol use: Never  . Drug use: Never    Allergies: No Known Allergies  Medications Prior to Admission  Medication Sig Dispense Refill Last Dose  . acetaminophen (TYLENOL) 325 MG tablet Take 2 tablets (650 mg total) by mouth every 4 (four) hours as needed (for pain scale < 4  OR  temperature  >/=  100.5 F). 30 tablet 1   . calcium carbonate (TUMS - DOSED IN MG ELEMENTAL CALCIUM) 500 MG chewable tablet Chew 2 tablets (400 mg of elemental calcium total) by mouth every 4 (four)  hours as needed for indigestion. 90 tablet 1   . docusate sodium (COLACE) 100 MG capsule Take 100 mg by mouth 2 (two) times daily.     Marland Kitchen enoxaparin (LOVENOX) 80 MG/0.8ML injection Inject 0.7 mLs (70 mg total) into the skin daily. 210 mL 0   . famotidine (PEPCID) 10 MG tablet Take 10 mg by mouth 2 (two) times daily.     . Ferrous Sulfate (IRON PO) Take by mouth.     . Prenatal Vit-Fe Fumarate-FA (PRENATAL MULTIVITAMIN) TABS tablet Take 1 tablet by mouth daily at 12 noon. 30 tablet 1   . progesterone (PROMETRIUM) 200 MG capsule Place 1 capsule (200 mg total) vaginally at bedtime. 30 capsule 1     Review of Systems  Constitutional: Positive for fatigue. Negative for fever.  Respiratory: Negative for shortness of breath.   Gastrointestinal: Positive for abdominal pain.  Genitourinary: Positive for pelvic pain. Negative for vaginal bleeding.  Musculoskeletal: Negative for back pain.  All other systems reviewed and are negative.  Physical Exam   Blood pressure (!) 150/96, pulse (!) 107, temperature 98.9 F (37.2 C),  resp. rate 16, height 5\' 10"  (1.778 m), weight (!) 157.4 kg, last menstrual period 06/18/2019, SpO2 100 %.   Physical Exam  Nursing note and vitals reviewed. Constitutional: She is oriented to person, place, and time. She appears well-developed and well-nourished.  Cardiovascular: Normal rate, normal heart sounds and intact distal pulses.  Respiratory: Effort normal and breath sounds normal.  GI: Soft. There is no abdominal tenderness. There is no CVA tenderness.  Gravid  Neurological: She is alert and oriented to person, place, and time.  Skin: Skin is warm and dry.  Psychiatric: She has a normal mood and affect. Her behavior is normal. Judgment and thought content normal.    MAU Course  Procedures  --New onset elevated BP in setting of recurrent pain. No severe range or severe symptoms. PEC labs WNL --Cervix unchanged from previous exam yesterday morning --Baby A  initially tachy, resolved with management of maternal pain --Reactive tracing x 2 prior to and between administrations of IV Nubain --Baby A: baseline 135, mod variability, positive accels, no decels --Baby B: Baseline 135, mod variability, positive accels, no decels --Toco: Irregular mild contractions q 3-7 minutes --Patient sleeping 30 minutes after Nubain --Voicemail left for Dr. 08/18/2019 after initial assessment. Returned my call at [phone] and agreed with plan to repeat Nubain for comfort, monitor for 3-4 hours for cervical change --Patient's cervix remains 3.5-4/80/0 after four hours continuous monitoring. Given second dose of Nubain at 1245 and discharged home per plan made with patient, Dr 07-17-1991 and Dr Ernestina Penna.  Patient Vitals for the past 24 hrs:  BP Temp Pulse Resp SpO2 Height Weight  02/24/20 1301 (!) 142/79 -- -- -- -- -- --  02/24/20 1246 (!) 127/91 -- -- -- -- -- --  02/24/20 1146 140/85 -- (!) 104 -- -- -- --  02/24/20 1131 123/66 -- 94 -- -- -- --  02/24/20 1116 (!) 141/78 -- 94 -- -- -- --  02/24/20 1101 132/78 -- 95 -- -- -- --  02/24/20 1046 (!) 147/83 -- 97 -- -- -- --  02/24/20 1031 (!) 147/85 -- 97 -- -- -- --  02/24/20 1016 128/79 -- 95 -- -- -- --  02/24/20 1001 132/73 -- 93 -- -- -- --  02/24/20 0946 139/84 -- (!) 102 -- -- -- --  02/24/20 0921 132/78 -- (!) 103 -- -- -- --  02/24/20 0904 (!) 150/96 -- (!) 107 -- -- -- --  02/24/20 0901 -- 98.9 F (37.2 C) (!) 104 16 100 % 5\' 10"  (1.778 m) (!) 157.4 kg  02/24/20 0857 -- -- (!) 104 -- -- -- --  02/24/20 0856 (!) 157/107 -- -- -- -- -- --    Results for orders placed or performed during the hospital encounter of 02/24/20 (from the past 24 hour(s))  CBC     Status: Abnormal   Collection Time: 02/24/20  9:09 AM  Result Value Ref Range   WBC 10.5 4.0 - 10.5 K/uL   RBC 4.62 3.87 - 5.11 MIL/uL   Hemoglobin 11.3 (L) 12.0 - 15.0 g/dL   HCT 02/26/20 02/26/20 - 78.2 %   MCV 80.1 80.0 - 100.0 fL   MCH 24.5 (L) 26.0 - 34.0  pg   MCHC 30.5 30.0 - 36.0 g/dL   RDW 42.3 (H) 53.6 - 14.4 %   Platelets 185 150 - 400 K/uL   nRBC 0.0 0.0 - 0.2 %  Comprehensive metabolic panel     Status: Abnormal   Collection Time: 02/24/20  9:09 AM  Result Value Ref Range   Sodium 139 135 - 145 mmol/L   Potassium 3.4 (L) 3.5 - 5.1 mmol/L   Chloride 106 98 - 111 mmol/L   CO2 21 (L) 22 - 32 mmol/L   Glucose, Bld 102 (H) 70 - 99 mg/dL   BUN <5 (L) 6 - 20 mg/dL   Creatinine, Ser 8.33 0.44 - 1.00 mg/dL   Calcium 9.0 8.9 - 82.5 mg/dL   Total Protein 7.2 6.5 - 8.1 g/dL   Albumin 2.8 (L) 3.5 - 5.0 g/dL   AST 23 15 - 41 U/L   ALT 21 0 - 44 U/L   Alkaline Phosphatase 192 (H) 38 - 126 U/L   Total Bilirubin 0.6 0.3 - 1.2 mg/dL   GFR calc non Af Amer >60 >60 mL/min   GFR calc Af Amer >60 >60 mL/min   Anion gap 12 5 - 15  Protein / creatinine ratio, urine     Status: Abnormal   Collection Time: 02/24/20  9:09 AM  Result Value Ref Range   Creatinine, Urine 86.17 mg/dL   Total Protein, Urine 15 mg/dL   Protein Creatinine Ratio 0.17 (H) 0.00 - 0.15 mg/mg[Cre]  Type and screen     Status: None   Collection Time: 02/24/20  9:20 AM  Result Value Ref Range   ABO/RH(D) B POS    Antibody Screen NEG    Sample Expiration      02/27/2020,2359 Performed at [facility], [address] [place], [place] [address]    Assessment and Plan  --29 y.o. G1P0 at [redacted]w[redacted]d with di/di twins --No cervical change in 4 hours  --New onset elevated BP, PEC labs normal --Denies pain at time of discharge --Discharge home in stable condition   F/U: --Patient to keep appt with [facility] tomorrow 02/25/2020  Calvert Cantor, CNM 02/24/2020, 2:20 PM

## 2020-02-24 NOTE — Discharge Instructions (Signed)

## 2020-02-25 ENCOUNTER — Inpatient Hospital Stay (HOSPITAL_COMMUNITY)
Admission: AD | Admit: 2020-02-25 | Discharge: 2020-03-02 | DRG: 786 | Disposition: A | Discharge status: Home / Self Care | Payer: BC Managed Care – PPO | Attending: Obstetrics & Gynecology | Admitting: Obstetrics & Gynecology

## 2020-02-25 ENCOUNTER — Inpatient Hospital Stay (HOSPITAL_COMMUNITY): Payer: BC Managed Care – PPO | Admitting: Anesthesiology

## 2020-02-25 ENCOUNTER — Other Ambulatory Visit: Payer: Self-pay

## 2020-02-25 ENCOUNTER — Encounter (HOSPITAL_COMMUNITY): Payer: Self-pay | Admitting: Obstetrics

## 2020-02-25 DIAGNOSIS — O99891 Other specified diseases and conditions complicating pregnancy: Secondary | ICD-10-CM

## 2020-02-25 DIAGNOSIS — O9081 Anemia of the puerperium: Secondary | ICD-10-CM | POA: Diagnosis not present

## 2020-02-25 DIAGNOSIS — O30043 Twin pregnancy, dichorionic/diamniotic, third trimester: Secondary | ICD-10-CM | POA: Diagnosis present

## 2020-02-25 DIAGNOSIS — O411232 Chorioamnionitis, third trimester, fetus 2: Secondary | ICD-10-CM | POA: Diagnosis present

## 2020-02-25 DIAGNOSIS — O99214 Obesity complicating childbirth: Secondary | ICD-10-CM | POA: Diagnosis present

## 2020-02-25 DIAGNOSIS — O99285 Endocrine, nutritional and metabolic diseases complicating the puerperium: Secondary | ICD-10-CM | POA: Diagnosis not present

## 2020-02-25 DIAGNOSIS — O411230 Chorioamnionitis, third trimester, not applicable or unspecified: Secondary | ICD-10-CM | POA: Diagnosis not present

## 2020-02-25 DIAGNOSIS — K219 Gastro-esophageal reflux disease without esophagitis: Secondary | ICD-10-CM | POA: Diagnosis present

## 2020-02-25 DIAGNOSIS — R7989 Other specified abnormal findings of blood chemistry: Secondary | ICD-10-CM

## 2020-02-25 DIAGNOSIS — Z0371 Encounter for suspected problem with amniotic cavity and membrane ruled out: Secondary | ICD-10-CM | POA: Diagnosis not present

## 2020-02-25 DIAGNOSIS — O411231 Chorioamnionitis, third trimester, fetus 1: Secondary | ICD-10-CM | POA: Diagnosis present

## 2020-02-25 DIAGNOSIS — N179 Acute kidney failure, unspecified: Secondary | ICD-10-CM | POA: Diagnosis not present

## 2020-02-25 DIAGNOSIS — E876 Hypokalemia: Secondary | ICD-10-CM | POA: Diagnosis not present

## 2020-02-25 DIAGNOSIS — O9962 Diseases of the digestive system complicating childbirth: Secondary | ICD-10-CM | POA: Diagnosis present

## 2020-02-25 DIAGNOSIS — O4703 False labor before 37 completed weeks of gestation, third trimester: Secondary | ICD-10-CM

## 2020-02-25 DIAGNOSIS — O1413 Severe pre-eclampsia, third trimester: Secondary | ICD-10-CM | POA: Diagnosis not present

## 2020-02-25 DIAGNOSIS — D72819 Decreased white blood cell count, unspecified: Secondary | ICD-10-CM | POA: Diagnosis not present

## 2020-02-25 DIAGNOSIS — O9852 Other viral diseases complicating childbirth: Secondary | ICD-10-CM | POA: Diagnosis present

## 2020-02-25 DIAGNOSIS — Z3A36 36 weeks gestation of pregnancy: Secondary | ICD-10-CM

## 2020-02-25 DIAGNOSIS — U071 COVID-19: Secondary | ICD-10-CM | POA: Diagnosis present

## 2020-02-25 DIAGNOSIS — O30042 Twin pregnancy, dichorionic/diamniotic, second trimester: Secondary | ICD-10-CM | POA: Diagnosis present

## 2020-02-25 DIAGNOSIS — D62 Acute posthemorrhagic anemia: Secondary | ICD-10-CM | POA: Diagnosis not present

## 2020-02-25 DIAGNOSIS — O1414 Severe pre-eclampsia complicating childbirth: Principal | ICD-10-CM | POA: Diagnosis present

## 2020-02-25 DIAGNOSIS — O99892 Other specified diseases and conditions complicating childbirth: Secondary | ICD-10-CM | POA: Diagnosis present

## 2020-02-25 DIAGNOSIS — O904 Postpartum acute kidney failure: Secondary | ICD-10-CM | POA: Diagnosis not present

## 2020-02-25 DIAGNOSIS — R519 Headache, unspecified: Secondary | ICD-10-CM | POA: Diagnosis present

## 2020-02-25 HISTORY — DX: Severe pre-eclampsia, third trimester: O14.13

## 2020-02-25 LAB — CBC
HCT: 34 % — ABNORMAL LOW (ref 36.0–46.0)
Hemoglobin: 10.4 g/dL — ABNORMAL LOW (ref 12.0–15.0)
MCH: 24.5 pg — ABNORMAL LOW (ref 26.0–34.0)
MCHC: 30.6 g/dL (ref 30.0–36.0)
MCV: 80.2 fL (ref 80.0–100.0)
Platelets: 170 10*3/uL (ref 150–400)
RBC: 4.24 MIL/uL (ref 3.87–5.11)
RDW: 20 % — ABNORMAL HIGH (ref 11.5–15.5)
WBC: 7.6 10*3/uL (ref 4.0–10.5)
nRBC: 0 % (ref 0.0–0.2)

## 2020-02-25 LAB — TYPE AND SCREEN
ABO/RH(D): B POS
Antibody Screen: NEGATIVE

## 2020-02-25 LAB — PROTEIN / CREATININE RATIO, URINE
Creatinine, Urine: 165.02 mg/dL
Protein Creatinine Ratio: 0.18 mg/mg{Cre} — ABNORMAL HIGH (ref 0.00–0.15)
Total Protein, Urine: 29 mg/dL

## 2020-02-25 LAB — AMNISURE RUPTURE OF MEMBRANE (ROM) NOT AT ARMC: Amnisure ROM: NEGATIVE

## 2020-02-25 LAB — COMPREHENSIVE METABOLIC PANEL
ALT: 19 U/L (ref 0–44)
AST: 29 U/L (ref 15–41)
Albumin: 2.5 g/dL — ABNORMAL LOW (ref 3.5–5.0)
Alkaline Phosphatase: 177 U/L — ABNORMAL HIGH (ref 38–126)
Anion gap: 14 (ref 5–15)
BUN: 6 mg/dL (ref 6–20)
CO2: 16 mmol/L — ABNORMAL LOW (ref 22–32)
Calcium: 8.6 mg/dL — ABNORMAL LOW (ref 8.9–10.3)
Chloride: 106 mmol/L (ref 98–111)
Creatinine, Ser: 1.14 mg/dL — ABNORMAL HIGH (ref 0.44–1.00)
GFR calc Af Amer: >60 mL/min (ref >60)
GFR calc non Af Amer: >60 mL/min (ref >60)
Glucose, Bld: 90 mg/dL (ref 70–99)
Potassium: 3.2 mmol/L — ABNORMAL LOW (ref 3.5–5.1)
Sodium: 136 mmol/L (ref 135–145)
Total Bilirubin: 0.7 mg/dL (ref 0.3–1.2)
Total Protein: 6.5 g/dL (ref 6.5–8.1)

## 2020-02-25 LAB — SARS CORONAVIRUS 2 BY RT PCR (HOSPITAL ORDER, PERFORMED IN ~~LOC~~ HOSPITAL LAB): SARS Coronavirus 2: POSITIVE — AB

## 2020-02-25 LAB — POCT FERN TEST: POCT Fern Test: NEGATIVE

## 2020-02-25 MED ORDER — BUTALBITAL-APAP-CAFFEINE 50-325-40 MG PO TABS
2.0000 | ORAL_TABLET | Freq: Once | ORAL | Status: AC
  Administered 2020-02-25: 2 via ORAL
  Filled 2020-02-25: qty 2

## 2020-02-25 MED ORDER — ACETAMINOPHEN 500 MG PO TABS
1000.0000 mg | ORAL_TABLET | Freq: Once | ORAL | Status: AC
  Administered 2020-02-25: 1000 mg via ORAL
  Filled 2020-02-25: qty 2

## 2020-02-25 MED ORDER — OXYCODONE-ACETAMINOPHEN 5-325 MG PO TABS: 1.0000 | ORAL_TABLET | ORAL | Status: DC | PRN

## 2020-02-25 MED ORDER — FLEET ENEMA 7-19 GM/118ML RE ENEM: 1.0000 | ENEMA | RECTAL | Status: DC | PRN

## 2020-02-25 MED ORDER — FENTANYL-BUPIVACAINE-NACL 0.5-0.125-0.9 MG/250ML-% EP SOLN
12.0000 mL/h | EPIDURAL | Status: DC | PRN
  Filled 2020-02-25: qty 250

## 2020-02-25 MED ORDER — ONDANSETRON HCL 4 MG/2ML IJ SOLN: 4.0000 mg | Freq: Four times a day (QID) | INTRAMUSCULAR | Status: DC | PRN

## 2020-02-25 MED ORDER — SODIUM CHLORIDE (PF) 0.9 % IJ SOLN
INTRAMUSCULAR | Status: DC | PRN
  Administered 2020-02-25: 13.5 mL/h via EPIDURAL

## 2020-02-25 MED ORDER — OXYTOCIN 40 UNITS IN NORMAL SALINE INFUSION - SIMPLE MED
1.0000 m[IU]/min | INTRAVENOUS | Status: DC
  Administered 2020-02-25: 2 m[IU]/min via INTRAVENOUS
  Filled 2020-02-25: qty 1000

## 2020-02-25 MED ORDER — OXYTOCIN 40 UNITS IN NORMAL SALINE INFUSION - SIMPLE MED: 2.5000 [IU]/h | INTRAVENOUS | Status: DC

## 2020-02-25 MED ORDER — LIDOCAINE HCL (PF) 1 % IJ SOLN: 30.0000 mL | INTRAMUSCULAR | Status: DC | PRN

## 2020-02-25 MED ORDER — OXYTOCIN BOLUS FROM INFUSION: 500.0000 mL | Freq: Once | INTRAVENOUS | Status: DC

## 2020-02-25 MED ORDER — SOD CITRATE-CITRIC ACID 500-334 MG/5ML PO SOLN
30.0000 mL | ORAL | Status: DC | PRN
  Administered 2020-02-25 – 2020-02-26 (×2): 30 mL via ORAL
  Filled 2020-02-25 (×2): qty 30

## 2020-02-25 MED ORDER — NALBUPHINE HCL 10 MG/ML IJ SOLN
10.0000 mg | INTRAMUSCULAR | Status: DC | PRN
  Administered 2020-02-25: 10 mg via INTRAVENOUS
  Filled 2020-02-25: qty 1

## 2020-02-25 MED ORDER — LIDOCAINE HCL (PF) 1 % IJ SOLN
INTRAMUSCULAR | Status: DC | PRN
  Administered 2020-02-25: 6 mL via EPIDURAL
  Administered 2020-02-25: 5 mL via EPIDURAL

## 2020-02-25 MED ORDER — OXYCODONE-ACETAMINOPHEN 5-325 MG PO TABS: 2.0000 | ORAL_TABLET | ORAL | Status: DC | PRN

## 2020-02-25 MED ORDER — LACTATED RINGERS IV SOLN: INTRAVENOUS | Status: DC

## 2020-02-25 MED ORDER — LACTATED RINGERS IV BOLUS
1000.0000 mL | Freq: Once | INTRAVENOUS | Status: AC
  Administered 2020-02-25: 1000 mL via INTRAVENOUS

## 2020-02-25 MED ORDER — EPHEDRINE 5 MG/ML INJ: 10.0000 mg | INTRAVENOUS | Status: DC | PRN

## 2020-02-25 MED ORDER — LACTATED RINGERS IV SOLN
500.0000 mL | Freq: Once | INTRAVENOUS | Status: AC
  Administered 2020-02-25: 500 mL via INTRAVENOUS

## 2020-02-25 MED ORDER — PHENYLEPHRINE 40 MCG/ML (10ML) SYRINGE FOR IV PUSH (FOR BLOOD PRESSURE SUPPORT): 80.0000 ug | PREFILLED_SYRINGE | INTRAVENOUS | Status: DC | PRN

## 2020-02-25 MED ORDER — LACTATED RINGERS IV SOLN: 500.0000 mL | INTRAVENOUS | Status: DC | PRN

## 2020-02-25 MED ORDER — DIPHENHYDRAMINE HCL 50 MG/ML IJ SOLN: 12.5000 mg | INTRAMUSCULAR | Status: DC | PRN

## 2020-02-25 MED ORDER — LACTATED RINGERS IV SOLN: 500.0000 mL | Freq: Once | INTRAVENOUS | Status: DC

## 2020-02-25 MED ORDER — TERBUTALINE SULFATE 1 MG/ML IJ SOLN: 0.2500 mg | Freq: Once | INTRAMUSCULAR | Status: DC | PRN

## 2020-02-25 MED ORDER — ACETAMINOPHEN 325 MG PO TABS: 650.0000 mg | ORAL_TABLET | ORAL | Status: DC | PRN

## 2020-02-25 NOTE — Anesthesia Procedure Notes (Signed)
Epidural Patient location during procedure: OB Start time: 02/25/2020 4:52 PM End time: 02/25/2020 5:02 PM  Staffing Anesthesiologist: Mal Amabile, MD Performed: anesthesiologist   Preanesthetic Checklist Completed: patient identified, IV checked, site marked, risks and benefits discussed, surgical consent, monitors and equipment checked, pre-op evaluation and timeout performed  Epidural Patient position: sitting Prep: DuraPrep and site prepped and draped Patient monitoring: continuous pulse ox and blood pressure Approach: midline Location: L4-L5 Injection technique: LOR air  Needle:  Needle type: Tuohy  Needle gauge: 17 G Needle length: 9 cm and 9 Needle insertion depth: 5 cm and 10 cm Catheter type: closed end flexible Catheter size: 19 Gauge Catheter at skin depth: 15 cm Test dose: negative and Other  Assessment Events: blood not aspirated, injection not painful, no injection resistance, no paresthesia and negative IV test  Additional Notes Patient identified. Risks and benefits discussed including failed block, incomplete  Pain control, post dural puncture headache, nerve damage, paralysis, blood pressure Changes, nausea, vomiting, reactions to medications-both toxic and allergic and post Partum back pain. All questions were answered. Patient expressed understanding and wished to proceed. Sterile technique was used throughout procedure. Epidural site was Dressed with sterile barrier dressing. No paresthesias, signs of intravascular injection Or signs of intrathecal spread were encountered.  Patient was more comfortable after the epidural was dosed. Please see RN's note for documentation of vital signs and FHR which are stable. Reason for block:procedure for pain

## 2020-02-25 NOTE — Progress Notes (Signed)
Paula Cross is a 29 y.o. G1P0 at [redacted]w[redacted]d by 1st trim sono. IOL for PEC with severe features incl elevated creatinine (1.12) and HA which has resolved since admission. Since admission, diagnosed to be COVID19 +.  Had fever, maternal and twin A tachycardia. Resolved with maternal tylenol..  Subjective: Pelvic pressure  Objective: BP 126/61   Pulse (!) 125   Temp 99.6 F (37.6 C) (Oral)   Resp (!) 25   Ht 5\' 11"  (1.803 m)   Wt (!) 158 kg   LMP 06/18/2019   SpO2 99%   BMI 48.58 kg/m  Patient Vitals for the past 24 hrs:  BP Temp Temp src Pulse Resp SpO2 Height Weight  02/25/20 1940 -- 99.6 F (37.6 C) Oral -- -- 99 % -- --  02/25/20 1936 -- -- -- -- -- 100 % -- --  02/25/20 1900 126/61 -- -- (!) 125 -- 100 % -- --  02/25/20 1800 129/78 -- -- (!) 122 -- 100 % -- --  02/25/20 1752 -- (!) 101.5 F (38.6 C) Axillary -- -- 100 % -- --  02/25/20 1735 112/67 -- -- (!) 113 -- 100 % -- --  02/25/20 1730 123/75 -- -- (!) 116 -- 100 % -- --  02/25/20 1725 118/73 -- -- (!) 117 -- 100 % -- --  02/25/20 1720 131/61 -- -- (!) 124 (!) 25 100 % -- --  02/25/20 1715 129/69 -- -- (!) 121 -- 100 % -- --  02/25/20 1710 (!) 123/57 -- -- (!) 123 -- 100 % -- --  02/25/20 1705 (!) 141/69 -- -- (!) 122 -- 100 % -- --  02/25/20 1700 140/79 -- -- (!) 122 -- 100 % -- --  02/25/20 1656 135/75 -- -- (!) 121 -- 100 % -- --  02/25/20 1636 (!) 143/66 -- -- (!) 118 -- -- -- --  02/25/20 1622 -- 98.8 F (37.1 C) Oral -- -- -- -- --  02/25/20 1615 129/77 -- -- (!) 122 -- -- -- --  02/25/20 1410 -- -- -- -- -- -- 5\' 11"  (1.803 m) (!) 158 kg  02/25/20 1405 135/76 99.5 F (37.5 C) Oral (!) 118 20 -- -- --  02/25/20 1316 128/80 -- -- (!) 121 -- -- -- --  02/25/20 1305 127/76 -- -- (!) 121 -- 99 % -- --  02/25/20 1301 123/68 -- -- (!) 127 -- -- -- --  02/25/20 1246 (!) 109/56 -- -- (!) 104 -- 96 % -- --  02/25/20 1216 (!) 123/47 -- -- (!) 106 -- -- -- --  02/25/20 1201 121/64 -- -- (!) 121 -- -- -- --   02/25/20 1150 132/65 -- -- (!) 123 -- 98 % -- --  02/25/20 1146 (!) 137/101 -- -- (!) 124 -- -- -- --  02/25/20 1040 (!) 142/78 98.4 F (36.9 C) Oral (!) 125 20 100 % -- --   FHT:  A 180s back to -160s since Tylenol. Mod variab, +accels, subtle early decels noted- cat I, watch progress of station           B also Cephalic, AGA. 150s + accels, no decels mod variab cat I UC:   regular, every 3 minutes SVE:   Dilation: 8.5 Effacement (%): 100 Station: -1 Exam by:: Marcene Duos, RN   Assessment / Plan: Induction of labor due to preeclampsia,  progressing well on pitocin. Twins. Di/Di, Vx/ Vx COVID 19 + mom with fever, normal O2 sats, close observation, resp  precautions, delivery planned in room   Labor: Progressing normally Preeclampsia:  no signs or symptoms of toxicity and intake and ouput balanced Fetal Wellbeing:  Category I Pain Control:  Epidural I/D:  n/a Anticipated MOD:  NSVD  Robley Fries 02/25/2020, 8:19 PM

## 2020-02-25 NOTE — MAU Note (Signed)
.   Paula Cross is a 29 y.o. at [redacted]w[redacted]d here in MAU reporting: ctx 15 minutes apart. Increased vaginal pressure. Patient reports increase in watery vaginal discharge and a scant amount of bleeding when she wipes. Twins vtx/vtx via [place] yesterday in MAU.   Pain score: 10 Vitals:   02/25/20 1040  BP: (!) 142/78  Pulse: (!) 125  Resp: 20  Temp: 98.4 F (36.9 C)  SpO2: 100%     FHT:170/139 Lab orders placed from triage:

## 2020-02-25 NOTE — Progress Notes (Signed)
Comfortable with epidural  FM x 2 No significant HA  Reactive fetal testing x 2 A: vtx, AROM clear 4/80%/ vtx -2  A/P Severe PEC by HA (now resolved and Cr) AROM now, start pit 2x2 didi twin Covid positive  Lendon Colonel 02/25/2020 4:15 PM

## 2020-02-25 NOTE — H&P (Signed)
Paula Cross is a 29 y.o. G1P0 at 108w0d presenting for HA and nausea. Pt notes continued contractions for the past 3 days. Pt has been seen in MAU daily x 3 days with contractions and pelvic pain. Pt always with reactive fetal testing, continued contractions on toco but no cervical change. Intermittently will have elevated bps but normal labs for pre-eclampsia, improved bp and pain with nubain and IV fluids.  Pt returned again today with complaints of contractions and today with HA. Pt notes limited oral intake overnight  due to nausea and occasional emesis. Pt notes 'burning' pelvic pain 'like my pelvis is going to explode.' Pt notes good FM x 2, passed mucous plug but no LOF or VB. On arrival to MAU pt had HA 8/10 that had not improved with several doses of tylenol pm overnight, 1g Tylenol in MAU or nubain.   [facility] at [facility] since 6 wks -Cervical incompetence.  Patient with no risk factors for cervical incompetence, no prior LEEP or cervical procedures.  Patient had normal cervical length at routine 19-week anatomy scan.  Patient return for follow-up anatomical images at 21 weeks and V shaped funneling was noted.  1.7 cm of closed cervical length was noted at that time, with 1.4 cm in length by 1.0 cm in width of the funnel.  Patient was started on vaginal Prometrium and home bedrest.  Follow-up ultrasound 4 days later showed stability of her cervical length and patient was continued on vaginal Prometrium and home bedrest.  1 week follow-up occurred at 23 weeks and  new worsening cervical incompetence was noted. Pt then admitted to hospital for bedrest until 30 wks. Remained on vaginal prometrium until yesterday. On bedrest at home til 35 wks. Lovenox prescribed while on bedrest, now off Lovenox x 1 wk.  -History of infertility.  This was a spontaneous pregnancy while awaiting start of fertility treatment. -Obesity.  Early diabetic screen 124. Nl 28 wk DS -Declined genetic screening,  normal quad. - GERD, on Protonix - anemia, on Ferralet -Gestational edema worsening over the past few days -Preeclampsia with severe features of headache and elevated creatinine as above -Growth ultrasound at 35 weeks: Baby A: 6 pound 7 ounces, 83rd percentile, vertex.  Baby B: 6 pounds 3 ounces, 78th percentile, vertex   Prenatal Transfer Tool  Maternal Diabetes: No Genetic Screening: Normal Maternal Ultrasounds/Referrals: Normal Fetal Ultrasounds or other Referrals:  Referred to Materal Fetal Medicine  Maternal Substance Abuse:  No Significant Maternal Medications:  None Significant Maternal Lab Results: Group B Strep negative     OB History    Gravida  1   Para      Term      Preterm      AB      Living        SAB      TAB      Ectopic      Multiple      Live Births             Past Medical History:  Diagnosis Date  . Medical history non-contributory   . Severe preeclampsia, third trimester 02/25/2020   Past Surgical History:  Procedure Laterality Date  . NO PAST SURGERIES     Family History: family history is not on file. Social History:  reports that she has never smoked. She has never used smokeless tobacco. She reports that she does not drink alcohol or use drugs.  Review of Systems - Negative except Pelvic  pain, headache which has eventually resolved after admission to labor and delivery, edema   Dilation: 4 Effacement (%): 80 Station: -2 Exam by:: Jericka Kadar Blood pressure 135/76, pulse (!) 118, temperature 99.5 F (37.5 C), temperature source Oral, resp. rate 20, height 5\' 11"  (1.803 m), weight (!) 158 kg, last menstrual period 06/18/2019, SpO2 99 %.  Physical Exam:  Gen: well appearing, no distress  Back: no CVAT Abd: gravid, NT, no RUQ pain LE: 3+ pitting edema, equal bilaterally, non-tender Toco: Q7 FH:A: baseline 140s, accelerations present, no deceleratons, 10 beat variability B: baseline 140s, accelerations present, no  deceleratons, 10 beat variability  Prenatal labs: ABO, Rh: --/--/B POS (05/18 0920) Antibody: NEG (05/18 0920) Rubella: Immune (11/19 0000) RPR: NON REACTIVE (03/24 0457)  HBsAg: Negative (11/19 0000)  HIV: NON REACTIVE (11/11 2239)  GBS:   Negative 1 hr Glucola 125  Genetic screening normal quad Anatomy US normal   Assessment/Plan: 29 y.o. G1P0 at [redacted]w[redacted]d Preeclampsia with severe features based on headache and elevated creatinine.  Recommend moved to delivery.  Initially plan was for magnesium sulfate for seizure prophylaxis however given that patient does not have any severe range blood pressures and no longer has headache will plan close watch and consider magnesium if headaches return or blood pressure rises.  Will still move with delivery. -Induction of labor.  Plan Pitocin 2 x 2.  Will AROM -GBS negative -Reactive fetal testing x2 -Mode of delivery: Plan vaginal delivery.  Patient aware that given twins currently in the vertex vertex position there remains a possibility of cesarean section at any point in labor and remains a possibility of vaginal delivery for baby A with the need for emergent C-section for baby B.  Patient hopeful to avoid this but agrees to proceed with trial of labor. -Covid positive lab just resulted after patient has been on labor floor.  Encouraged husband to get tested.  Discussed with patient shared decision making an option to separate mom from baby versus mom to continue to care for baby for feeding but with close hand hygiene and wearing a mask at all times.  Patient is preferring the latter.  Ala Dach 02/25/2020 3:56 PM

## 2020-02-25 NOTE — Anesthesia Preprocedure Evaluation (Signed)
Anesthesia Evaluation  Patient identified by MRN, date of birth, ID band Patient awake    Reviewed: Allergy & Precautions, NPO status , Patient's Chart, lab work & pertinent test results, reviewed documented beta blocker date and time   Airway Mallampati: III  TM Distance: >3 FB Neck ROM: Full    Dental no notable dental hx. (+) Teeth Intact   Pulmonary  Covid +   Pulmonary exam normal breath sounds clear to auscultation       Cardiovascular hypertension, Pt. on medications Normal cardiovascular exam Rhythm:Regular Rate:Normal     Neuro/Psych negative neurological ROS  negative psych ROS   GI/Hepatic Neg liver ROS, GERD  Medicated and Controlled,  Endo/Other  Morbid obesity  Renal/GU negative Renal ROS  negative genitourinary   Musculoskeletal negative musculoskeletal ROS (+)   Abdominal (+) + obese,   Peds  Hematology  (+) anemia ,   Anesthesia Other Findings   Reproductive/Obstetrics (+) Pregnancy Pre eclampsia                             Anesthesia Physical Anesthesia Plan  ASA: III  Anesthesia Plan: Epidural   Post-op Pain Management:    Induction:   PONV Risk Score and Plan:   Airway Management Planned: Natural Airway  Additional Equipment:   Intra-op Plan:   Post-operative Plan:   Informed Consent: I have reviewed the patients History and Physical, chart, labs and discussed the procedure including the risks, benefits and alternatives for the proposed anesthesia with the patient or authorized representative who has indicated his/her understanding and acceptance.       Plan Discussed with: Anesthesiologist  Anesthesia Plan Comments:         Anesthesia Quick Evaluation

## 2020-02-25 NOTE — MAU Provider Note (Signed)
No chief complaint on file.    First Provider Initiated Contact with Patient 02/25/20 1133      S: Paula Cross  is a 29 y.o. y.o. year old G37P0 female at [redacted]w[redacted]d weeks gestation who presents to MAU with worsening contractions and mildly elevated blood pressures.  Positive Hx gestational HTN. Current blood pressure medication: None.  DI/DI twins.  Vertex/vertex at last ultrasound per patient.  BMZ 2/17, 2/18.  GBS- 5/14.  Associated symptoms: 6/10 headache since last night.  No improvement with 1 g of Tylenol at 8 PM and 3 AM, negative vision changes, negative epigastric pain Contractions: Every 15 minutes, strong Vaginal bleeding: Pink-tinged mucus Fetal movement: Active Noticed increased sensation of wetness at 1 AM.  Describes it as thin mucus.  O:  Patient Vitals for the past 24 hrs:  BP Temp Temp src Pulse Resp SpO2  02/25/20 1316 128/80 -- -- (!) 121 -- --  02/25/20 1305 127/76 -- -- (!) 121 -- 99 %  02/25/20 1301 123/68 -- -- (!) 127 -- --  02/25/20 1246 (!) 109/56 -- -- (!) 104 -- 96 %  02/25/20 1216 (!) 123/47 -- -- (!) 106 -- --  02/25/20 1201 121/64 -- -- (!) 121 -- --  02/25/20 1150 132/65 -- -- (!) 123 -- 98 %  02/25/20 1146 (!) 137/101 -- -- (!) 124 -- --  02/25/20 1040 (!) 142/78 98.4 F (36.9 C) Oral (!) 125 20 100 %     General: Moderate distress Heart: Tachycardic in 120s initially, decreased to 90's after fluids and pain meds Lungs: Normal rate and effort Abd: Soft, NT, Gravid, S>D Extremities: 2+ pedal edema Neuro: 2+ deep tendon reflexes, No clonus Pelvic: NEFG, negative pull, scant bloody show.    Dilation: 4 Effacement (%): 80 Cervical Position: Middle Station: -2 Presentation: Vertex Exam by:: Lemmie Evens Flippin RN    Repeat exam by Manya Silvas, CNM no change.  Baby A: EFM: 150, Moderate variability, 15 x 15 accelerations, no decelerations, initial reactive NST then difficult to trace and tachycardic, very active baby.   - 140 baseline after  fluids  EFM: 140, Moderate variability, 15 x 15 accelerations, no decelerations Toco: Q 5, moderate  Results for orders placed or performed during the hospital encounter of 02/25/20 (from the past 24 hour(s))  CBC     Status: Abnormal   Collection Time: 02/25/20 11:34 AM  Result Value Ref Range   WBC 7.6 4.0 - 10.5 K/uL   RBC 4.24 3.87 - 5.11 MIL/uL   Hemoglobin 10.4 (L) 12.0 - 15.0 g/dL   HCT 34.0 (L) 36.0 - 46.0 %   MCV 80.2 80.0 - 100.0 fL   MCH 24.5 (L) 26.0 - 34.0 pg   MCHC 30.6 30.0 - 36.0 g/dL   RDW 20.0 (H) 11.5 - 15.5 %   Platelets 170 150 - 400 K/uL   nRBC 0.0 0.0 - 0.2 %  Comprehensive metabolic panel     Status: Abnormal   Collection Time: 02/25/20 11:34 AM  Result Value Ref Range   Sodium 136 135 - 145 mmol/L   Potassium 3.2 (L) 3.5 - 5.1 mmol/L   Chloride 106 98 - 111 mmol/L   CO2 16 (L) 22 - 32 mmol/L   Glucose, Bld 90 70 - 99 mg/dL   BUN 6 6 - 20 mg/dL   Creatinine, Ser 1.14 (H) 0.44 - 1.00 mg/dL   Calcium 8.6 (L) 8.9 - 10.3 mg/dL   Total Protein 6.5 6.5 - 8.1  g/dL   Albumin 2.5 (L) 3.5 - 5.0 g/dL   AST 29 15 - 41 U/L   ALT 19 0 - 44 U/L   Alkaline Phosphatase 177 (H) 38 - 126 U/L   Total Bilirubin 0.7 0.3 - 1.2 mg/dL   GFR calc non Af Amer >60 >60 mL/min   GFR calc Af Amer >60 >60 mL/min   Anion gap 14 5 - 15  Amnisure rupture of membrane (rom)not at [facility]     Status: None   Collection Time: 02/25/20 11:58 AM  Result Value Ref Range   Amnisure ROM NEGATIVE   POCT fern test     Status: None   Collection Time: 02/25/20 12:27 PM  Result Value Ref Range   POCT Fern Test Negative = intact amniotic membranes     MAU Course Orders Placed This Encounter  Procedures  . SARS Coronavirus 2 by RT PCR (hospital order, performed in [facility] hospital lab) Nasopharyngeal Nasopharyngeal Swab    Standing Status:   Standing    Number of Occurrences:   1    Order Specific Question:   Is this test for diagnosis or screening    Answer:   Screening    Order  Specific Question:   Symptomatic for COVID-19 as defined by CDC    Answer:   No    Order Specific Question:   Hospitalized for COVID-19    Answer:   No    Order Specific Question:   Admitted to ICU for COVID-19    Answer:   No    Order Specific Question:   Previously tested for COVID-19    Answer:   Yes    Order Specific Question:   Resident in a congregate (group) care setting    Answer:   No    Order Specific Question:   Employed in healthcare setting    Answer:   No    Order Specific Question:   Pregnant    Answer:   Yes    Order Specific Question:   Has patient completed COVID vaccination(s) (2 doses of Pfizer/Moderna 1 dose of [employer])    Answer:   No  . CBC    Standing Status:   Standing    Number of Occurrences:   1    Order Specific Question:   Specimen collection method    Answer:   Unit=Unit collect  . Comprehensive metabolic panel    Standing Status:   Standing    Number of Occurrences:   1    Order Specific Question:   Specimen collection method    Answer:   Unit=Unit collect  . Protein / creatinine ratio, urine    Standing Status:   Standing    Number of Occurrences:   1  . Amnisure rupture of membrane (rom)not at [facility]    Standing Status:   Standing    Number of Occurrences:   1  . Fern Test    Standing Status:   Standing    Number of Occurrences:   1  . POCT fern test    Standing Status:   Standing    Number of Occurrences:   1  . Insert peripheral IV    Standing Status:   Standing    Number of Occurrences:   1   Meds ordered this encounter  Medications  . lactated ringers bolus 1,000 mL  . butalbital-acetaminophen-caffeine (FIORICET) 50-325-40 MG per tablet 2 tablet  . nalbuphine (NUBAIN) injection 10 mg  Will give fluid bolus for preterm contractions, maternal tachycardia and fetal tachycardia.   Will give Fioricet for headache draw preeclampsia labs. Nubain for contraction pain  No improvement in headache after Fioricet, Nubain and IV  fluids.  Notify Dr. Algie Coffer of elevated blood pressure, persistent headache, elevated creatinine, preterm contractions without further cervical change.  Will admit.  MDM - Ruled out ROM.Fern negative x2, negative pool, negative AmniSure. - Preterm contractions without cervical change - Preeclampsia with severe features based on persistent headache and elevated creatinine.  A: [redacted]w[redacted]d week IUP 1. Preeclampsia, severe, third trimester   2. Twin pregnancy, dichorionic/diamniotic, third trimester   3. Preterm uterine contractions in third trimester, antepartum   4. No leakage of amniotic fluid into vagina   5. Elevated serum creatinine   FHR reactive x 2 Maternal tachycardia likely due to pain and distress.  Decreased with fluids and pain meds.  P: Admit to labor and delivery per Noland Fordyce, MD. Will discuss plan of care with Dr. Mitzi Hansen who will assume care of patient.  Katrinka Blazing, [place], CNM 02/25/2020 1:35 PM

## 2020-02-26 ENCOUNTER — Encounter (HOSPITAL_COMMUNITY)
Admission: AD | Disposition: A | Discharge status: Home / Self Care | Payer: Self-pay | Source: Home / Self Care | Attending: Obstetrics & Gynecology

## 2020-02-26 ENCOUNTER — Encounter (HOSPITAL_COMMUNITY): Payer: Self-pay | Admitting: Obstetrics

## 2020-02-26 DIAGNOSIS — O99892 Other specified diseases and conditions complicating childbirth: Secondary | ICD-10-CM | POA: Diagnosis present

## 2020-02-26 LAB — COMPREHENSIVE METABOLIC PANEL
ALT: 19 U/L (ref 0–44)
AST: 29 U/L (ref 15–41)
Albumin: 1.9 g/dL — ABNORMAL LOW (ref 3.5–5.0)
Alkaline Phosphatase: 139 U/L — ABNORMAL HIGH (ref 38–126)
Anion gap: 11 (ref 5–15)
BUN: 9 mg/dL (ref 6–20)
CO2: 18 mmol/L — ABNORMAL LOW (ref 22–32)
Calcium: 8.2 mg/dL — ABNORMAL LOW (ref 8.9–10.3)
Chloride: 104 mmol/L (ref 98–111)
Creatinine, Ser: 1.48 mg/dL — ABNORMAL HIGH (ref 0.44–1.00)
GFR calc Af Amer: 55 mL/min — ABNORMAL LOW (ref >60)
GFR calc non Af Amer: 48 mL/min — ABNORMAL LOW (ref >60)
Glucose, Bld: 101 mg/dL — ABNORMAL HIGH (ref 70–99)
Potassium: 3.1 mmol/L — ABNORMAL LOW (ref 3.5–5.1)
Sodium: 133 mmol/L — ABNORMAL LOW (ref 135–145)
Total Bilirubin: 0.5 mg/dL (ref 0.3–1.2)
Total Protein: 4.9 g/dL — ABNORMAL LOW (ref 6.5–8.1)

## 2020-02-26 LAB — CBC WITH DIFFERENTIAL/PLATELET
Abs Immature Granulocytes: 0.03 10*3/uL (ref 0.00–0.07)
Basophils Absolute: 0 10*3/uL (ref 0.0–0.1)
Basophils Relative: 0 %
Eosinophils Absolute: 0 10*3/uL (ref 0.0–0.5)
Eosinophils Relative: 0 %
HCT: 24 % — ABNORMAL LOW (ref 36.0–46.0)
Hemoglobin: 7.5 g/dL — ABNORMAL LOW (ref 12.0–15.0)
Immature Granulocytes: 0 %
Lymphocytes Relative: 4 %
Lymphs Abs: 0.3 10*3/uL — ABNORMAL LOW (ref 0.7–4.0)
MCH: 24.8 pg — ABNORMAL LOW (ref 26.0–34.0)
MCHC: 31.3 g/dL (ref 30.0–36.0)
MCV: 79.2 fL — ABNORMAL LOW (ref 80.0–100.0)
Monocytes Absolute: 0.5 10*3/uL (ref 0.1–1.0)
Monocytes Relative: 6 %
Neutro Abs: 7.1 10*3/uL (ref 1.7–7.7)
Neutrophils Relative %: 90 %
Platelets: 127 10*3/uL — ABNORMAL LOW (ref 150–400)
RBC: 3.03 MIL/uL — ABNORMAL LOW (ref 3.87–5.11)
RDW: 19.8 % — ABNORMAL HIGH (ref 11.5–15.5)
WBC: 8 10*3/uL (ref 4.0–10.5)
nRBC: 0 % (ref 0.0–0.2)

## 2020-02-26 LAB — CBC
HCT: 23.7 % — ABNORMAL LOW (ref 36.0–46.0)
Hemoglobin: 7.4 g/dL — ABNORMAL LOW (ref 12.0–15.0)
MCH: 24.7 pg — ABNORMAL LOW (ref 26.0–34.0)
MCHC: 31.2 g/dL (ref 30.0–36.0)
MCV: 79.3 fL — ABNORMAL LOW (ref 80.0–100.0)
Platelets: 145 10*3/uL — ABNORMAL LOW (ref 150–400)
RBC: 2.99 MIL/uL — ABNORMAL LOW (ref 3.87–5.11)
RDW: 19.5 % — ABNORMAL HIGH (ref 11.5–15.5)
WBC: 8.8 10*3/uL (ref 4.0–10.5)
nRBC: 0 % (ref 0.0–0.2)

## 2020-02-26 LAB — RPR: RPR Ser Ql: NONREACTIVE

## 2020-02-26 SURGERY — Surgical Case
Anesthesia: Epidural

## 2020-02-26 MED ORDER — KETAMINE HCL 10 MG/ML IJ SOLN
INTRAMUSCULAR | Status: DC | PRN
  Administered 2020-02-26: 30 mg via INTRAVENOUS
  Administered 2020-02-26: 20 mg via INTRAVENOUS

## 2020-02-26 MED ORDER — NALBUPHINE HCL 10 MG/ML IJ SOLN: 5.0000 mg | Freq: Once | INTRAMUSCULAR | Status: DC | PRN

## 2020-02-26 MED ORDER — MEPERIDINE HCL 25 MG/ML IJ SOLN: 6.2500 mg | INTRAMUSCULAR | Status: DC | PRN

## 2020-02-26 MED ORDER — CARBOPROST TROMETHAMINE 250 MCG/ML IM SOLN
INTRAMUSCULAR | Status: AC
  Filled 2020-02-26: qty 1

## 2020-02-26 MED ORDER — SCOPOLAMINE 1 MG/3DAYS TD PT72: 1.0000 | MEDICATED_PATCH | Freq: Once | TRANSDERMAL | Status: DC

## 2020-02-26 MED ORDER — DEXTROSE 5 % IV SOLN
INTRAVENOUS | Status: DC | PRN
  Administered 2020-02-26: 3 g via INTRAVENOUS

## 2020-02-26 MED ORDER — OXYCODONE HCL 5 MG/5ML PO SOLN: 5.0000 mg | Freq: Once | ORAL | Status: DC | PRN

## 2020-02-26 MED ORDER — ENOXAPARIN SODIUM 80 MG/0.8ML ~~LOC~~ SOLN
0.5000 mg/kg | SUBCUTANEOUS | Status: DC
  Administered 2020-02-27 – 2020-03-02 (×5): 80 mg via SUBCUTANEOUS
  Filled 2020-02-26 (×5): qty 0.8

## 2020-02-26 MED ORDER — POTASSIUM CHLORIDE 20 MEQ PO PACK
40.0000 meq | PACK | Freq: Two times a day (BID) | ORAL | Status: AC
  Administered 2020-02-26 (×2): 40 meq via ORAL
  Filled 2020-02-26 (×3): qty 2

## 2020-02-26 MED ORDER — FENTANYL CITRATE (PF) 100 MCG/2ML IJ SOLN
INTRAMUSCULAR | Status: DC | PRN
  Administered 2020-02-26: 100 ug via EPIDURAL

## 2020-02-26 MED ORDER — PRENATAL MULTIVITAMIN CH
1.0000 | ORAL_TABLET | Freq: Every day | ORAL | Status: DC
  Administered 2020-02-26 – 2020-03-02 (×6): 1 via ORAL
  Filled 2020-02-26 (×6): qty 1

## 2020-02-26 MED ORDER — OXYTOCIN 40 UNITS IN NORMAL SALINE INFUSION - SIMPLE MED
INTRAVENOUS | Status: AC
  Filled 2020-02-26: qty 1000

## 2020-02-26 MED ORDER — FERROUS SULFATE 325 (65 FE) MG PO TABS
325.0000 mg | ORAL_TABLET | Freq: Every day | ORAL | Status: DC
  Administered 2020-02-26 – 2020-02-27 (×2): 325 mg via ORAL
  Filled 2020-02-26 (×2): qty 1

## 2020-02-26 MED ORDER — OXYCODONE HCL 5 MG PO TABS: 5.0000 mg | ORAL_TABLET | Freq: Once | ORAL | Status: DC | PRN

## 2020-02-26 MED ORDER — DIPHENHYDRAMINE HCL 25 MG PO CAPS: 25.0000 mg | ORAL_CAPSULE | Freq: Four times a day (QID) | ORAL | Status: DC | PRN

## 2020-02-26 MED ORDER — NALBUPHINE HCL 10 MG/ML IJ SOLN: 5.0000 mg | INTRAMUSCULAR | Status: DC | PRN

## 2020-02-26 MED ORDER — SIMETHICONE 80 MG PO CHEW
80.0000 mg | CHEWABLE_TABLET | ORAL | Status: DC
  Administered 2020-02-27 – 2020-03-01 (×5): 80 mg via ORAL
  Filled 2020-02-26 (×5): qty 1

## 2020-02-26 MED ORDER — LIDOCAINE-EPINEPHRINE (PF) 2 %-1:200000 IJ SOLN
INTRAMUSCULAR | Status: AC
  Filled 2020-02-26: qty 10

## 2020-02-26 MED ORDER — KETOROLAC TROMETHAMINE 30 MG/ML IJ SOLN
30.0000 mg | Freq: Once | INTRAMUSCULAR | Status: AC | PRN
  Administered 2020-02-26: 30 mg via INTRAVENOUS

## 2020-02-26 MED ORDER — NALOXONE HCL 4 MG/10ML IJ SOLN
1.0000 ug/kg/h | INTRAVENOUS | Status: DC | PRN
  Filled 2020-02-26: qty 5

## 2020-02-26 MED ORDER — DIBUCAINE (PERIANAL) 1 % EX OINT: 1.0000 "application " | TOPICAL_OINTMENT | CUTANEOUS | Status: DC | PRN

## 2020-02-26 MED ORDER — SODIUM CHLORIDE 0.9% FLUSH: 3.0000 mL | INTRAVENOUS | Status: DC | PRN

## 2020-02-26 MED ORDER — KETOROLAC TROMETHAMINE 30 MG/ML IJ SOLN: 30.0000 mg | Freq: Four times a day (QID) | INTRAMUSCULAR | Status: DC | PRN

## 2020-02-26 MED ORDER — LACTATED RINGERS IV SOLN: INTRAVENOUS | Status: DC

## 2020-02-26 MED ORDER — TRANEXAMIC ACID-NACL 1000-0.7 MG/100ML-% IV SOLN
INTRAVENOUS | Status: DC | PRN
  Administered 2020-02-26: 1000 mg via INTRAVENOUS

## 2020-02-26 MED ORDER — MORPHINE SULFATE (PF) 0.5 MG/ML IJ SOLN
INTRAMUSCULAR | Status: DC | PRN
  Administered 2020-02-26: 2 mg via INTRAVENOUS

## 2020-02-26 MED ORDER — OXYTOCIN 40 UNITS IN NORMAL SALINE INFUSION - SIMPLE MED
INTRAVENOUS | Status: DC | PRN
  Administered 2020-02-26: 500 mL via INTRAVENOUS

## 2020-02-26 MED ORDER — OXYTOCIN 40 UNITS IN NORMAL SALINE INFUSION - SIMPLE MED: 2.5000 [IU]/h | INTRAVENOUS | Status: AC

## 2020-02-26 MED ORDER — ZOLPIDEM TARTRATE 5 MG PO TABS: 5.0000 mg | ORAL_TABLET | Freq: Every evening | ORAL | Status: DC | PRN

## 2020-02-26 MED ORDER — IBUPROFEN 600 MG PO TABS: 600.0000 mg | ORAL_TABLET | Freq: Four times a day (QID) | ORAL | Status: DC | PRN

## 2020-02-26 MED ORDER — WITCH HAZEL-GLYCERIN EX PADS: 1.0000 "application " | MEDICATED_PAD | CUTANEOUS | Status: DC | PRN

## 2020-02-26 MED ORDER — KETAMINE HCL 50 MG/5ML IJ SOSY
PREFILLED_SYRINGE | INTRAMUSCULAR | Status: AC
  Filled 2020-02-26: qty 5

## 2020-02-26 MED ORDER — FAMOTIDINE 20 MG PO TABS
10.0000 mg | ORAL_TABLET | Freq: Two times a day (BID) | ORAL | Status: DC
  Administered 2020-02-26 – 2020-03-02 (×11): 10 mg via ORAL
  Filled 2020-02-26 (×11): qty 1

## 2020-02-26 MED ORDER — TRANEXAMIC ACID-NACL 1000-0.7 MG/100ML-% IV SOLN
INTRAVENOUS | Status: AC
  Filled 2020-02-26: qty 100

## 2020-02-26 MED ORDER — KETOROLAC TROMETHAMINE 30 MG/ML IJ SOLN
INTRAMUSCULAR | Status: AC
  Filled 2020-02-26: qty 1

## 2020-02-26 MED ORDER — DOCUSATE SODIUM 100 MG PO CAPS
100.0000 mg | ORAL_CAPSULE | Freq: Every day | ORAL | Status: DC
  Administered 2020-02-26 – 2020-03-01 (×5): 100 mg via ORAL
  Filled 2020-02-26 (×5): qty 1

## 2020-02-26 MED ORDER — DEXMEDETOMIDINE HCL IN NACL 200 MCG/50ML IV SOLN
INTRAVENOUS | Status: AC
  Filled 2020-02-26: qty 50

## 2020-02-26 MED ORDER — ACETAMINOPHEN 500 MG PO TABS
ORAL_TABLET | ORAL | Status: AC
  Filled 2020-02-26: qty 2

## 2020-02-26 MED ORDER — SENNOSIDES-DOCUSATE SODIUM 8.6-50 MG PO TABS
2.0000 | ORAL_TABLET | ORAL | Status: DC
  Administered 2020-02-27 – 2020-02-28 (×3): 2 via ORAL
  Filled 2020-02-26 (×3): qty 2

## 2020-02-26 MED ORDER — NALOXONE HCL 0.4 MG/ML IJ SOLN: 0.4000 mg | INTRAMUSCULAR | Status: DC | PRN

## 2020-02-26 MED ORDER — COCONUT OIL OIL: 1.0000 "application " | TOPICAL_OIL | Status: DC | PRN

## 2020-02-26 MED ORDER — DEXAMETHASONE SODIUM PHOSPHATE 10 MG/ML IJ SOLN
INTRAMUSCULAR | Status: AC
  Filled 2020-02-26: qty 1

## 2020-02-26 MED ORDER — DIPHENHYDRAMINE HCL 25 MG PO CAPS
25.0000 mg | ORAL_CAPSULE | ORAL | Status: DC | PRN
  Filled 2020-02-26: qty 1

## 2020-02-26 MED ORDER — CARBOPROST TROMETHAMINE 250 MCG/ML IM SOLN
INTRAMUSCULAR | Status: DC | PRN
Start: 2020-02-26 — End: 2020-02-26
  Administered 2020-02-26: 250 ug via INTRAMUSCULAR

## 2020-02-26 MED ORDER — ACETAMINOPHEN 500 MG PO TABS
1000.0000 mg | ORAL_TABLET | Freq: Four times a day (QID) | ORAL | Status: DC
  Administered 2020-02-26: 1000 mg via ORAL

## 2020-02-26 MED ORDER — MENTHOL 3 MG MT LOZG: 1.0000 | LOZENGE | OROMUCOSAL | Status: DC | PRN

## 2020-02-26 MED ORDER — HYDROMORPHONE HCL 1 MG/ML IJ SOLN
0.2500 mg | INTRAMUSCULAR | Status: DC | PRN
  Administered 2020-02-26: 0.5 mg via INTRAVENOUS

## 2020-02-26 MED ORDER — ONDANSETRON HCL 4 MG/2ML IJ SOLN: 4.0000 mg | Freq: Three times a day (TID) | INTRAMUSCULAR | Status: DC | PRN

## 2020-02-26 MED ORDER — MORPHINE SULFATE (PF) 0.5 MG/ML IJ SOLN
INTRAMUSCULAR | Status: AC
  Filled 2020-02-26: qty 10

## 2020-02-26 MED ORDER — DEXMEDETOMIDINE HCL 200 MCG/2ML IV SOLN
INTRAVENOUS | Status: DC | PRN
  Administered 2020-02-26: 12 ug via INTRAVENOUS

## 2020-02-26 MED ORDER — SIMETHICONE 80 MG PO CHEW
80.0000 mg | CHEWABLE_TABLET | Freq: Three times a day (TID) | ORAL | Status: DC
  Administered 2020-02-26 – 2020-03-02 (×15): 80 mg via ORAL
  Filled 2020-02-26 (×15): qty 1

## 2020-02-26 MED ORDER — MORPHINE SULFATE (PF) 0.5 MG/ML IJ SOLN
INTRAMUSCULAR | Status: DC | PRN
  Administered 2020-02-26: 3 mg via EPIDURAL

## 2020-02-26 MED ORDER — DIPHENHYDRAMINE HCL 50 MG/ML IJ SOLN: 12.5000 mg | INTRAMUSCULAR | Status: DC | PRN

## 2020-02-26 MED ORDER — SIMETHICONE 80 MG PO CHEW: 80.0000 mg | CHEWABLE_TABLET | ORAL | Status: DC | PRN

## 2020-02-26 MED ORDER — HYDROMORPHONE HCL 1 MG/ML IJ SOLN
INTRAMUSCULAR | Status: AC
  Filled 2020-02-26: qty 0.5

## 2020-02-26 MED ORDER — LIDOCAINE-EPINEPHRINE (PF) 2 %-1:200000 IJ SOLN
INTRAMUSCULAR | Status: DC | PRN
  Administered 2020-02-26: 10 mL via EPIDURAL
  Administered 2020-02-26 (×2): 2 mL via EPIDURAL

## 2020-02-26 MED ORDER — TETANUS-DIPHTH-ACELL PERTUSSIS 5-2.5-18.5 LF-MCG/0.5 IM SUSP: 0.5000 mL | Freq: Once | INTRAMUSCULAR | Status: DC

## 2020-02-26 MED ORDER — SODIUM CHLORIDE 0.9 % IV SOLN
2.0000 g | Freq: Four times a day (QID) | INTRAVENOUS | Status: DC
  Administered 2020-02-26 – 2020-02-27 (×6): 2 g via INTRAVENOUS
  Filled 2020-02-26 (×9): qty 2

## 2020-02-26 MED ORDER — ACETAMINOPHEN 500 MG PO TABS
1000.0000 mg | ORAL_TABLET | Freq: Four times a day (QID) | ORAL | Status: DC
  Administered 2020-02-26 – 2020-03-02 (×20): 1000 mg via ORAL
  Filled 2020-02-26 (×20): qty 2

## 2020-02-26 MED ORDER — ONDANSETRON HCL 4 MG/2ML IJ SOLN
INTRAMUSCULAR | Status: AC
  Filled 2020-02-26: qty 2

## 2020-02-26 MED ORDER — FENTANYL CITRATE (PF) 100 MCG/2ML IJ SOLN
INTRAMUSCULAR | Status: AC
  Filled 2020-02-26: qty 2

## 2020-02-26 MED ORDER — DEXTROSE 5 % IV SOLN
INTRAVENOUS | Status: AC
  Filled 2020-02-26: qty 3000

## 2020-02-26 MED ORDER — OXYCODONE HCL 5 MG PO TABS
10.0000 mg | ORAL_TABLET | Freq: Four times a day (QID) | ORAL | Status: DC | PRN
  Administered 2020-02-26 – 2020-03-02 (×17): 10 mg via ORAL
  Filled 2020-02-26 (×17): qty 2

## 2020-02-26 MED ORDER — PROMETHAZINE HCL 25 MG/ML IJ SOLN: 6.2500 mg | INTRAMUSCULAR | Status: DC | PRN

## 2020-02-26 SURGICAL SUPPLY — 37 items
BENZOIN TINCTURE PRP APPL 2/3 (GAUZE/BANDAGES/DRESSINGS) ×2 IMPLANT
CHLORAPREP W/TINT 26ML (MISCELLANEOUS) ×2 IMPLANT
CLAMP CORD UMBIL (MISCELLANEOUS) IMPLANT
CLOSURE STERI STRIP 1/2 X4 (GAUZE/BANDAGES/DRESSINGS) ×2 IMPLANT
CLOTH BEACON ORANGE TIMEOUT ST (SAFETY) ×2 IMPLANT
DRESSING PREVENA PLUS CUSTOM (GAUZE/BANDAGES/DRESSINGS) ×1 IMPLANT
DRSG OPSITE POSTOP 4X10 (GAUZE/BANDAGES/DRESSINGS) ×2 IMPLANT
DRSG PREVENA PLUS CUSTOM (GAUZE/BANDAGES/DRESSINGS) ×2
ELECT REM PT RETURN 9FT ADLT (ELECTROSURGICAL) ×2
ELECTRODE REM PT RTRN 9FT ADLT (ELECTROSURGICAL) ×1 IMPLANT
EXTRACTOR VACUUM KIWI (MISCELLANEOUS) IMPLANT
EXTRACTOR VACUUM M CUP 4 TUBE (SUCTIONS) IMPLANT
GLOVE BIO SURGEON STRL SZ7 (GLOVE) ×2 IMPLANT
GLOVE BIOGEL PI IND STRL 7.0 (GLOVE) ×2 IMPLANT
GLOVE BIOGEL PI INDICATOR 7.0 (GLOVE) ×2
GOWN STRL REUS W/TWL LRG LVL3 (GOWN DISPOSABLE) ×4 IMPLANT
KIT ABG SYR 3ML LUER SLIP (SYRINGE) IMPLANT
NEEDLE HYPO 25X5/8 SAFETYGLIDE (NEEDLE) IMPLANT
NS IRRIG 1000ML POUR BTL (IV SOLUTION) ×2 IMPLANT
PACK C SECTION WH (CUSTOM PROCEDURE TRAY) ×2 IMPLANT
PAD OB MATERNITY 4.3X12.25 (PERSONAL CARE ITEMS) ×2 IMPLANT
RTRCTR C-SECT PINK 25CM LRG (MISCELLANEOUS) IMPLANT
STRIP CLOSURE SKIN 1/2X4 (GAUZE/BANDAGES/DRESSINGS) IMPLANT
SUT MNCRL 0 VIOLET CTX 36 (SUTURE) ×2 IMPLANT
SUT MONOCRYL 0 CTX 36 (SUTURE) ×2
SUT PLAIN 0 NONE (SUTURE) IMPLANT
SUT PLAIN 2 0 (SUTURE)
SUT PLAIN ABS 2-0 CT1 27XMFL (SUTURE) IMPLANT
SUT VIC AB 0 CT1 27 (SUTURE) ×2
SUT VIC AB 0 CT1 27XBRD ANBCTR (SUTURE) ×2 IMPLANT
SUT VIC AB 2-0 CT1 27 (SUTURE) ×1
SUT VIC AB 2-0 CT1 TAPERPNT 27 (SUTURE) ×1 IMPLANT
SUT VIC AB 4-0 KS 27 (SUTURE) ×2 IMPLANT
SUT VICRYL 0 TIES 12 18 (SUTURE) IMPLANT
TOWEL OR 17X24 6PK STRL BLUE (TOWEL DISPOSABLE) ×2 IMPLANT
TRAY FOLEY W/BAG SLVR 14FR LF (SET/KITS/TRAYS/PACK) IMPLANT
WATER STERILE IRR 1000ML POUR (IV SOLUTION) ×2 IMPLANT

## 2020-02-26 NOTE — Progress Notes (Signed)
Chart review done   Patient Vitals for the past 24 hrs:  BP Temp Temp src Pulse Resp SpO2  02/26/20 0850 117/60 -- -- -- -- 98 %  02/26/20 0657 -- 99.8 F (37.7 C) Oral -- -- --  02/26/20 0445 (!) 119/56 (!) 100.5 F (38.1 C) Oral (!) 135 18 98 %  02/26/20 0400 96/61 100.1 F (37.8 C) -- (!) 140 18 97 %  02/26/20 0330 140/74 -- -- (!) 129 19 96 %  02/26/20 0325 -- -- -- (!) 142 19 --  02/26/20 0324 -- -- -- (!) 141 17 --  02/26/20 0323 -- -- -- (!) 140 17 --  02/26/20 0322 -- -- -- (!) 134 (!) 25 --  02/26/20 0315 (!) 146/77 (!) 101.5 F (38.6 C) -- (!) 161 (!) 22 --  02/26/20 0314 -- -- -- (!) 159 (!) 22 --  02/26/20 0302 116/68 -- -- (!) 141 14 --  02/26/20 0257 131/74 -- -- (!) 153 (!) 21 --  02/26/20 0256 131/74 -- -- (!) 153 (!) 23 --  02/26/20 0245 -- 99.9 F (37.7 C) -- -- -- --  02/26/20 0230 (!) 139/59 -- -- (!) 152 (!) 26 100 %  02/26/20 0217 (!) 120/40 (!) 100.5 F (38.1 C) Oral -- -- --  02/26/20 0044 127/72 -- -- (!) 134 -- --  02/26/20 0038 (!) 78/63 -- -- (!) 137 -- --  02/26/20 0037 -- (!) 100.7 F (38.2 C) Oral -- -- --  02/26/20 0035 (!) 86/45 -- -- (!) 153 -- --  02/26/20 0009 (!) 125/55 -- -- (!) 142 (!) 22 --  02/26/20 0000 (!) 121/100 -- -- (!) 158 -- --  02/25/20 2331 135/64 -- -- (!) 122 -- --  02/25/20 2329 131/73 -- -- (!) 120 -- --  02/25/20 2327 -- 99.5 F (37.5 C) Oral -- -- --  02/25/20 2315 -- -- -- -- -- 99 %  02/25/20 2310 -- -- -- -- -- 99 %  02/25/20 2305 -- -- -- -- -- 99 %  02/25/20 2300 125/63 -- -- (!) 127 -- 99 %  02/25/20 2240 -- -- -- -- -- 98 %  02/25/20 2235 -- -- -- -- -- 100 %  02/25/20 2230 (!) 110/59 -- -- (!) 118 -- 97 %  02/25/20 2225 -- -- -- -- -- 98 %  02/25/20 2220 -- -- -- -- -- 97 %  02/25/20 2215 -- -- -- -- -- 99 %  02/25/20 2213 (!) 124/59 -- -- (!) 126 -- --   Vs not recorded since 0850 this am.  Spoke with current nurse who is about to check vs now.  Nurse will also try to find vs from today.  House  supervisor Marylene Land informed and she will speak with charge nurse to help address this now.

## 2020-02-26 NOTE — Op Note (Signed)
Cesarean Section Procedure Note 02/26/2020  Paula Cross  Indications: Arrest of dilatation at 6 cm. Di-Di twins, Induction for Preeclampsia with severe features, materrnal obesity, incompetent cervix. Covid19 positive, 36 weeks    Pre-operative Diagnosis: Arrest of dilation at 6 cm .   Post-operative Diagnosis: Same   Surgeon: Shea Evans, MD   Assistants: Arlan Organ, CNM  Anesthesia: epidural   Procedure Details:  The patient was seen in the Labor Room. The risks, benefits, complications, treatment options, and expected outcomes were discussed with the patient. The patient concurred with the proposed plan, giving informed consent. identified as Druscilla Brownie and the procedure verified as C-Section Delivery. A Time Out was held and the above information confirmed. 3 gm Ancef given. Tracksee placed for pannus retraction. Epidural anesthesia was increased to surgical level, the patient was draped and prepped in the usual sterile manner, foley was draining urine well.  A pfannenstiel incision was made and carried down through the subcutaneous tissue to the fascia. Fascial incision was made and extended transversely. The fascia was separated from the underlying rectus tissue superiorly and inferiorly. The peritoneum was identified and entered. Peritoneal incision was extended longitudinally. Alexis-O retractor placed. Bladder flap was not created only upper edge of bladder identified.  A low transverse uterine incision was made. Slight odor noted in amniotic fluid but it was clear. Twin A, GIRL, delivered cephalic at 1.17 AM with vigorous cry and Apgar score of 8 and 9 at 1 and 5 min. CNM Renae Fickle held twin A, while amniotomy of twin B BOY performed and slight odor noted in fluid but fluid was clear. Twin B BOY was delivered cephalic at 1.17 AM with Apgar score of 8 and 8 at 1 and 5 min. Delayed cord clamping done for both babies after 1 minute and babies handed to NICU teams in  attendance.  Cord ph was not sent. Cord blood was obtained for evaluation. The placenta was removed Intact and appeared normal. The uterine outline, tubes and ovaries appeared normal. The uterine incision was closed with running locked sutures of 0-Monocryl. A second imbricating layer sutured.   Hemostasis was observed. Alexis retractor removed. Peritoneal closure done with 2-0 Vicryl.  The fascia was then reapproximated with running sutures of 0Vicryl. The subcuticular closure was performed using 2-0plain gut. The skin was closed with 4-0Vicryl. Pravena negative pressure dressing placed.   Instrument, sponge, and needle counts were correct prior the abdominal closure and were correct at the conclusion of the case.   Findings:Slight foul odor noted at both amniotomies. Kerr hysterotomy. Twin A Girl, cephalic delivery, Twin B Boy, cephalic delivery. Uterine atony, TXA and Hemabate x 1 dose.     Estimated Blood Loss: 1500 cc  Total IV Fluids: 2500 ml LR   Urine Output: 150CC OF clear urine  Specimens: cord blood x 2 and placenta x2   Complications: Uterine atony, needing TXA and Hemabate x 1 dose   Disposition: PACU - hemodynamically stable.   Maternal Condition: stable   Baby condition / location:  Couplet care / Skin to Skin  Attending Attestation: I performed the procedure.   Signed: Surgeon(s): Shea Evans, MD

## 2020-02-26 NOTE — Progress Notes (Signed)
POD #0, 1' LTCS for arrest of dilatation at 6 cm, 36 wks Mercie Eon Twins- Girl Zollie Scale) Boy Pamelia Hoit) Preeclampsia with severe features- ie elevated creatinine COVID19 infection, stable Chorioamnionitis - on Cefoxitin  Subjective: Postpartum Day 0: Cesarean Delivery with birth at 1.17 am on 02/26/20  Patient reports feeling very well. No CP cough/ SOB/ chills/ fever. C/s pain well controlled   Objective: Vital signs in last 24 hours: Temp:  [98.8 F (37.1 C)-101.5 F (38.6 C)] 99.8 F (37.7 C) (05/20 0657) Pulse Rate:  [104-161] 135 (05/20 0445) Resp:  [14-26] 18 (05/20 0445) BP: (78-146)/(40-101) 117/60 (05/20 0850) SpO2:  [96 %-100 %] 98 % (05/20 0850) Weight:  [158 kg] 158 kg (05/19 1410)  Patient Vitals for the past 24 hrs:  BP Temp Temp src Pulse Resp SpO2 Height Weight  02/26/20 0850 117/60 -- -- -- -- 98 % -- --  02/26/20 0657 -- 99.8 F (37.7 C) Oral -- -- -- -- --  02/26/20 0445 (!) 119/56 (!) 100.5 F (38.1 C) Oral (!) 135 18 98 % -- --  02/26/20 0400 96/61 100.1 F (37.8 C) -- (!) 140 18 97 % -- --  02/26/20 0330 140/74 -- -- (!) 129 19 96 % -- --  02/26/20 0325 -- -- -- (!) 142 19 -- -- --  02/26/20 0324 -- -- -- (!) 141 17 -- -- --  02/26/20 0323 -- -- -- (!) 140 17 -- -- --  02/26/20 0322 -- -- -- (!) 134 (!) 25 -- -- --  02/26/20 0315 (!) 146/77 (!) 101.5 F (38.6 C) -- (!) 161 (!) 22 -- -- --  02/26/20 0314 -- -- -- (!) 159 (!) 22 -- -- --  02/26/20 0302 116/68 -- -- (!) 141 14 -- -- --  02/26/20 0257 131/74 -- -- (!) 153 (!) 21 -- -- --  02/26/20 0256 131/74 -- -- (!) 153 (!) 23 -- -- --  02/26/20 0245 -- 99.9 F (37.7 C) -- -- -- -- -- --  02/26/20 0230 (!) 139/59 -- -- (!) 152 (!) 26 100 % -- --  02/26/20 0217 (!) 120/40 (!) 100.5 F (38.1 C) Oral -- -- -- -- --  02/26/20 0044 127/72 -- -- (!) 134 -- -- -- --  02/26/20 0038 (!) 78/63 -- -- (!) 137 -- -- -- --  02/26/20 0037 -- (!) 100.7 F (38.2 C) Oral -- -- -- -- --  02/26/20 0035 (!) 86/45 -- -- (!) 153  -- -- -- --  02/26/20 0009 (!) 125/55 -- -- (!) 142 (!) 22 -- -- --  02/26/20 0000 (!) 121/100 -- -- (!) 158 -- -- -- --  02/25/20 2331 135/64 -- -- (!) 122 -- -- -- --  02/25/20 2329 131/73 -- -- (!) 120 -- -- -- --  02/25/20 2327 -- 99.5 F (37.5 C) Oral -- -- -- -- --  02/25/20 2315 -- -- -- -- -- 99 % -- --  02/25/20 2310 -- -- -- -- -- 99 % -- --  02/25/20 2305 -- -- -- -- -- 99 % -- --  02/25/20 2300 125/63 -- -- (!) 127 -- 99 % -- --  02/25/20 2240 -- -- -- -- -- 98 % -- --  02/25/20 2235 -- -- -- -- -- 100 % -- --  02/25/20 2230 (!) 110/59 -- -- (!) 118 -- 97 % -- --  02/25/20 2225 -- -- -- -- -- 98 % -- --  02/25/20 2220 -- -- -- -- --  97 % -- --  02/25/20 2215 -- -- -- -- -- 99 % -- --  02/25/20 2213 (!) 124/59 -- -- (!) 126 -- -- -- --  02/25/20 2210 -- 99.5 F (37.5 C) Oral -- (!) 24 100 % -- --  02/25/20 2206 -- -- -- -- -- 98 % -- --  02/25/20 2150 -- -- -- -- -- 100 % -- --  02/25/20 2140 (!) 104/58 -- -- (!) 129 -- -- -- --  02/25/20 2100 (!) 125/59 -- -- (!) 127 -- -- -- --  02/25/20 2038 (!) 121/53 -- -- (!) 134 (!) 22 -- -- --  02/25/20 1940 -- 99.6 F (37.6 C) Oral -- -- 99 % -- --  02/25/20 1936 -- -- -- -- -- 100 % -- --  02/25/20 1900 126/61 -- -- (!) 125 -- 100 % -- --  02/25/20 1800 129/78 -- -- (!) 122 -- 100 % -- --  02/25/20 1752 -- (!) 101.5 F (38.6 C) Axillary -- -- 100 % -- --  02/25/20 1735 112/67 -- -- (!) 113 -- 100 % -- --  02/25/20 1730 123/75 -- -- (!) 116 -- 100 % -- --  02/25/20 1725 118/73 -- -- (!) 117 -- 100 % -- --  02/25/20 1720 131/61 -- -- (!) 124 (!) 25 100 % -- --  02/25/20 1715 129/69 -- -- (!) 121 -- 100 % -- --  02/25/20 1710 (!) 123/57 -- -- (!) 123 -- 100 % -- --  02/25/20 1705 (!) 141/69 -- -- (!) 122 -- 100 % -- --  02/25/20 1700 140/79 -- -- (!) 122 -- 100 % -- --  02/25/20 1656 135/75 -- -- (!) 121 -- 100 % -- --  02/25/20 1636 (!) 143/66 -- -- (!) 118 -- -- -- --  02/25/20 1622 -- 98.8 F (37.1 C) Oral -- -- -- --  --  02/25/20 1615 129/77 -- -- (!) 122 -- -- -- --  02/25/20 1410 -- -- -- -- -- -- 5\' 11"  (1.803 m) (!) 158 kg  02/25/20 1405 135/76 99.5 F (37.5 C) Oral (!) 118 20 -- -- --  02/25/20 1316 128/80 -- -- (!) 121 -- -- -- --  02/25/20 1305 127/76 -- -- (!) 121 -- 99 % -- --  02/25/20 1301 123/68 -- -- (!) 127 -- -- -- --  02/25/20 1246 (!) 109/56 -- -- (!) 104 -- 96 % -- --  02/25/20 1216 (!) 123/47 -- -- (!) 106 -- -- -- --  02/25/20 1201 121/64 -- -- (!) 121 -- -- -- --  02/25/20 1150 132/65 -- -- (!) 123 -- 98 % -- --  02/25/20 1146 (!) 137/101 -- -- (!) 124 -- -- -- --   BPs none in severe range, no antiHTN meds, maternal tachycardia, Last Tmax at 3.15 am of 101.5   Intake/Output Summary (Last 24 hours) at 02/26/2020 1055 Last data filed at 02/26/2020 0300 Gross per 24 hour  Intake 2000 ml  Output 2422 ml  Net -422 ml    Physical Exam:  General: alert and cooperative  Lungs CTA  CV mild tachycardia, close f/up Lochia: appropriate Uterine Fundus: firm Incision: healing well DVT Evaluation: No evidence of DVT seen on physical exam.  CBC Latest Ref Rng & Units 02/26/2020 02/26/2020 02/25/2020  WBC 4.0 - 10.5 K/uL 8.8 8.0 7.6  Hemoglobin 12.0 - 15.0 g/dL 7.4(L) 7.5(L) 10.4(L)  Hematocrit 36.0 - 46.0 % 23.7(L) 24.0(L) 34.0(L)  Platelets 150 -  400 K/uL 145(L) 127(L) 170    Assessment/Plan: Status post Primary Urgent Cesarean section. POD#0 PEC- stable BPs, creatinine worse, IO good, continue to monitor, repeat labs in AM Acute on chronic anemia- will give IV iron once afebrile >24 hrs Chorioamnionitis- Cefoxitin 48hrs  Covid19- O2 sats nl, closely monitor Maternal obesity- on prophylactic Lovenox starting 12hrs post- c/section.  Elveria Royals 02/26/2020, 10:52 AM

## 2020-02-26 NOTE — Progress Notes (Signed)
Pt decided that she is not comfortable using the donor breast milk, and that she would like to supplement with Similac.  Explained to MOB the importance of getting started pumping.  She stated that she has her own DEBP that she prefers to use.  Explained to mom to pump every three hours after placing babies to breast.  Mom agrees to start pumping after next feeding.

## 2020-02-26 NOTE — Transfer of Care (Signed)
Immediate Anesthesia Transfer of Care Note  Patient: Paula Cross  Procedure(s) Performed: CESAREAN SECTION (N/A )  Patient Location: PACU  Anesthesia Type:Epidural  Level of Consciousness: awake, alert  and oriented  Airway & Oxygen Therapy: Patient Spontanous Breathing  Post-op Assessment: Report given to RN and Post -op Vital signs reviewed and stable  Post vital signs: Reviewed and stable  Last Vitals:  Vitals Value Taken Time  BP    Temp    Pulse    Resp    SpO2      Last Pain:  Vitals:   02/26/20 0037  TempSrc: Oral  PainSc:          Complications: No apparent anesthesia complications

## 2020-02-26 NOTE — Progress Notes (Signed)
Paula Cross is a 29 y.o. G1P0 at 36w1 d by 1st trim sono. IOL for PEC with severe features incl elevated creatinine (1.12) and Since admission, diagnosed to be COVID19 +.  Had fever, maternal and twin A tachycardia. Resolved with maternal tylenol..  Subjective: Pelvic pressure, rectal and suprapubic pressure getting worse   Objective: BP (!) 121/100   Pulse (!) 158   Temp 99.5 F (37.5 C) (Oral)   Resp (!) 24   Ht 5\' 11"  (1.803 m)   Wt (!) 158 kg   LMP 06/18/2019   SpO2 99%   BMI 48.58 kg/m  Patient Vitals for the past 24 hrs:  BP Temp Temp src Pulse Resp SpO2 Height Weight  02/26/20 0000 (!) 121/100 -- -- (!) 158 -- -- -- --  02/25/20 2331 135/64 -- -- (!) 122 -- -- -- --  02/25/20 2329 131/73 -- -- (!) 120 -- -- -- --  02/25/20 2327 -- 99.5 F (37.5 C) Oral -- -- -- -- --  02/25/20 2315 -- -- -- -- -- 99 % -- --  02/25/20 2310 -- -- -- -- -- 99 % -- --  02/25/20 2305 -- -- -- -- -- 99 % -- --  02/25/20 2300 125/63 -- -- (!) 127 -- 99 % -- --  02/25/20 2240 -- -- -- -- -- 98 % -- --  02/25/20 2235 -- -- -- -- -- 100 % -- --  02/25/20 2230 (!) 110/59 -- -- (!) 118 -- 97 % -- --  02/25/20 2225 -- -- -- -- -- 98 % -- --  02/25/20 2220 -- -- -- -- -- 97 % -- --  02/25/20 2215 -- -- -- -- -- 99 % -- --  02/25/20 2213 (!) 124/59 -- -- (!) 126 -- -- -- --  02/25/20 2210 -- 99.5 F (37.5 C) Oral -- (!) 24 100 % -- --  02/25/20 2206 -- -- -- -- -- 98 % -- --  02/25/20 2150 -- -- -- -- -- 100 % -- --  02/25/20 2140 (!) 104/58 -- -- (!) 129 -- -- -- --  02/25/20 2100 (!) 125/59 -- -- (!) 127 -- -- -- --  02/25/20 2038 (!) 121/53 -- -- (!) 134 (!) 22 -- -- --  02/25/20 1940 -- 99.6 F (37.6 C) Oral -- -- 99 % -- --  02/25/20 1936 -- -- -- -- -- 100 % -- --  02/25/20 1900 126/61 -- -- (!) 125 -- 100 % -- --  02/25/20 1800 129/78 -- -- (!) 122 -- 100 % -- --  02/25/20 1752 -- (!) 101.5 F (38.6 C) Axillary -- -- 100 % -- --  02/25/20 1735 112/67 -- -- (!) 113 -- 100 % --  --  02/25/20 1730 123/75 -- -- (!) 116 -- 100 % -- --  02/25/20 1725 118/73 -- -- (!) 117 -- 100 % -- --  02/25/20 1720 131/61 -- -- (!) 124 (!) 25 100 % -- --  02/25/20 1715 129/69 -- -- (!) 121 -- 100 % -- --  02/25/20 1710 (!) 123/57 -- -- (!) 123 -- 100 % -- --  02/25/20 1705 (!) 141/69 -- -- (!) 122 -- 100 % -- --  02/25/20 1700 140/79 -- -- (!) 122 -- 100 % -- --  02/25/20 1656 135/75 -- -- (!) 121 -- 100 % -- --  02/25/20 1636 (!) 143/66 -- -- (!) 118 -- -- -- --  02/25/20 1622 -- 98.8 F (37.1  C) Oral -- -- -- -- --  02/25/20 1615 129/77 -- -- (!) 122 -- -- -- --  02/25/20 1410 -- -- -- -- -- -- 5\' 11"  (0.300 m) (!) 158 kg  02/25/20 1405 135/76 99.5 F (37.5 C) Oral (!) 118 20 -- -- --  02/25/20 1316 128/80 -- -- (!) 121 -- -- -- --  02/25/20 1305 127/76 -- -- (!) 121 -- 99 % -- --  02/25/20 1301 123/68 -- -- (!) 127 -- -- -- --  02/25/20 1246 (!) 109/56 -- -- (!) 104 -- 96 % -- --  02/25/20 1216 (!) 123/47 -- -- (!) 106 -- -- -- --  02/25/20 1201 121/64 -- -- (!) 121 -- -- -- --  02/25/20 1150 132/65 -- -- (!) 123 -- 98 % -- --  02/25/20 1146 (!) 137/101 -- -- (!) 124 -- -- -- --  02/25/20 1040 (!) 142/78 98.4 F (36.9 C) Oral (!) 125 20 100 % -- --   FHT:  A Vx, 160s Mod variab, +accels, no decels, cat I           B also Cephalic, AGA. 150s + accels, no decels mod variab cat I UC:   regular, every 2- 3 minutes SVE:   Dilation: 6 Effacement (%): 90 Station: -1 Exam by:: Dr. Benjie Karvonen Unchanged over hours, caput, swelling, head is tunneling rather than progressing in station   Assessment / Plan: Arrest of dilatation, likely CPD. Twins. Di/Di, Vx/ Vx COVID 70 + mom with fever, normal O2 sats, close observation, resp precautions Proceed with C-section Risks/complications of surgery reviewed incl infection, bleeding, damage to internal organs including bladder, bowels, ureters, blood vessels, other risks from anesthesia, VTE and delayed complications of any surgery,  complications in future surgery reviewed. Also discussed neonatal complications incl difficult delivery, laceration, vacuum assistance, TTN etc. Pt understands and agrees, all concerns addressed.     Elveria Royals 02/26/2020, 12:07 AM

## 2020-02-26 NOTE — Anesthesia Postprocedure Evaluation (Signed)
Anesthesia Post Note  Patient: Paula Cross  Procedure(s) Performed: CESAREAN SECTION (N/A )     Patient location during evaluation: PACU Anesthesia Type: Epidural Level of consciousness: awake and alert and oriented Pain management: pain level controlled Vital Signs Assessment: post-procedure vital signs reviewed and stable Respiratory status: spontaneous breathing, nonlabored ventilation and respiratory function stable Cardiovascular status: blood pressure returned to baseline and stable Postop Assessment: no headache, no backache, spinal receding and patient able to bend at knees Anesthetic complications: no    Last Vitals:  Vitals:   02/26/20 0257 02/26/20 0302  BP: 131/74 116/68  Pulse: (!) 153 (!) 141  Resp: (!) 21 14  Temp:    SpO2:      Last Pain:  Vitals:   02/26/20 0259  TempSrc:   PainSc: 7    Pain Goal:                Epidural/Spinal Function Cutaneous sensation: Able to Discern Pressure (02/26/20 0300), Patient able to flex knees: Yes (02/26/20 0300), Patient able to lift hips off bed: No (02/26/20 0300), Back pain beyond tenderness at insertion site: No (02/26/20 0300), Progressively worsening motor and/or sensory loss: No (02/26/20 0300), Bowel and/or bladder incontinence post epidural: No (02/26/20 0300)  Lannie Fields

## 2020-02-26 NOTE — Lactation Note (Signed)
This note was copied from a baby's chart. Lactation Consultation Note  Patient Name: Paula Cross RCVELFYBO Closson FBPZW'C Date: 02/26/2020 Reason for consult: Initial assessment;Other (Comment);Multiple gestation;Primapara;Late-preterm 34-36.6wks(covid pos)   LPTI baby B has tight jaws/ suck training attempted.  Infant would clamp down on finger and several seconds passed before infant would suck began then infant would chomp down again.  Mom has recently fed him 5 ml with a bottle.  She states it took almost an hour because he wouldn't suck.  LC reviewed suck training, waking techniques, and LPTI guidelines and information.  Parents are actively doing STS.    Mom desires to try to BF baby B.  He cued but could not sustain latch, but sucked his tongue.  Mom has large pendulous breasts.  LC used rolled blankets to help support the breast when attempting to assist with latch. A couple of different positions tried.  LC applied #24 NS, mom demonstrated appropriate application as well.  Formula used to prefil NS.  Infant was placed in football hold on left side.  He latched and fed 1.5 minutes then stopped. When BF, NS was not visible and mom denied discomfort. NS filled again to attempt to latch but infant clamped down on NS and would not suck.  Mom has pumped twice today.  She is aware she needs to pump every 2-3 hours.  LC offered to set up hospital DEBP. Mom prefers her Spectra.  Family is aware of lactation services and support groups as well as phone line.    LC encouraged mom to continue to pump, hand express and collect into bullets provided.  She understands the importance of continuing to pump to stimulate milk supply while LPTI's are learning to breastfeed.  LC also reviewed supplementation guidelines.   Maternal Data Has patient been taught Hand Expression?: Yes Does the patient have breastfeeding experience prior to this delivery?: No  Feeding Feeding Type: Breast Fed  LATCH  Score Latch: Too sleepy or reluctant, no latch achieved, no sucking elicited.  Audible Swallowing: None  Type of Nipple: Flat(semi flat, shaft is short)  Comfort (Breast/Nipple): Soft / non-tender  Hold (Positioning): Assistance needed to correctly position infant at breast and maintain latch.  LATCH Score: 4  Interventions Interventions: Breast feeding basics reviewed;Assisted with latch;Skin to skin;Breast massage;Hand express;Position options;Support pillows;Adjust position  Lactation Tools Discussed/Used Tools: Nipple Shields Nipple shield size: 24   Consult Status Consult Status: Follow-up Date: 02/27/20 Follow-up type: In-patient    Maryruth Hancock [facility] 02/26/2020, 4:47 PM

## 2020-02-27 LAB — CBC WITH DIFFERENTIAL/PLATELET
Abs Immature Granulocytes: 0.05 10*3/uL (ref 0.00–0.07)
Basophils Absolute: 0 10*3/uL (ref 0.0–0.1)
Basophils Relative: 0 %
Eosinophils Absolute: 0 10*3/uL (ref 0.0–0.5)
Eosinophils Relative: 0 %
HCT: 14.4 % — ABNORMAL LOW (ref 36.0–46.0)
Hemoglobin: 4.8 g/dL — CL (ref 12.0–15.0)
Immature Granulocytes: 1 %
Lymphocytes Relative: 6 %
Lymphs Abs: 0.4 10*3/uL — ABNORMAL LOW (ref 0.7–4.0)
MCH: 25.7 pg — ABNORMAL LOW (ref 26.0–34.0)
MCHC: 33.3 g/dL (ref 30.0–36.0)
MCV: 77 fL — ABNORMAL LOW (ref 80.0–100.0)
Monocytes Absolute: 0.3 10*3/uL (ref 0.1–1.0)
Monocytes Relative: 5 %
Neutro Abs: 5.8 10*3/uL (ref 1.7–7.7)
Neutrophils Relative %: 88 %
Platelets: 236 10*3/uL (ref 150–400)
RBC: 1.87 MIL/uL — ABNORMAL LOW (ref 3.87–5.11)
RDW: 19.8 % — ABNORMAL HIGH (ref 11.5–15.5)
WBC: 6.6 10*3/uL (ref 4.0–10.5)
nRBC: 0 % (ref 0.0–0.2)

## 2020-02-27 LAB — COMPREHENSIVE METABOLIC PANEL
ALT: 26 U/L (ref 0–44)
AST: 57 U/L — ABNORMAL HIGH (ref 15–41)
Albumin: 1.7 g/dL — ABNORMAL LOW (ref 3.5–5.0)
Alkaline Phosphatase: 120 U/L (ref 38–126)
Anion gap: 7 (ref 5–15)
BUN: 9 mg/dL (ref 6–20)
CO2: 23 mmol/L (ref 22–32)
Calcium: 8 mg/dL — ABNORMAL LOW (ref 8.9–10.3)
Chloride: 109 mmol/L (ref 98–111)
Creatinine, Ser: 1.41 mg/dL — ABNORMAL HIGH (ref 0.44–1.00)
GFR calc Af Amer: 59 mL/min — ABNORMAL LOW (ref >60)
GFR calc non Af Amer: 51 mL/min — ABNORMAL LOW (ref >60)
Glucose, Bld: 63 mg/dL — ABNORMAL LOW (ref 70–99)
Potassium: 3.4 mmol/L — ABNORMAL LOW (ref 3.5–5.1)
Sodium: 139 mmol/L (ref 135–145)
Total Bilirubin: 0.6 mg/dL (ref 0.3–1.2)
Total Protein: 4.4 g/dL — ABNORMAL LOW (ref 6.5–8.1)

## 2020-02-27 LAB — PREPARE RBC (CROSSMATCH)

## 2020-02-27 LAB — CBC
HCT: 21.4 % — ABNORMAL LOW (ref 36.0–46.0)
Hemoglobin: 6.7 g/dL — CL (ref 12.0–15.0)
MCH: 25.1 pg — ABNORMAL LOW (ref 26.0–34.0)
MCHC: 31.3 g/dL (ref 30.0–36.0)
MCV: 80.1 fL (ref 80.0–100.0)
Platelets: 169 10*3/uL (ref 150–400)
RBC: 2.67 MIL/uL — ABNORMAL LOW (ref 3.87–5.11)
RDW: 19.9 % — ABNORMAL HIGH (ref 11.5–15.5)
WBC: 5.7 10*3/uL (ref 4.0–10.5)
nRBC: 0 % (ref 0.0–0.2)

## 2020-02-27 LAB — BASIC METABOLIC PANEL
Anion gap: 9 (ref 5–15)
BUN: 7 mg/dL (ref 6–20)
CO2: 22 mmol/L (ref 22–32)
Calcium: 7.9 mg/dL — ABNORMAL LOW (ref 8.9–10.3)
Chloride: 109 mmol/L (ref 98–111)
Creatinine, Ser: 1.2 mg/dL — ABNORMAL HIGH (ref 0.44–1.00)
GFR calc Af Amer: >60 mL/min (ref >60)
GFR calc non Af Amer: >60 mL/min (ref >60)
Glucose, Bld: 84 mg/dL (ref 70–99)
Potassium: 3.1 mmol/L — ABNORMAL LOW (ref 3.5–5.1)
Sodium: 140 mmol/L (ref 135–145)

## 2020-02-27 LAB — LACTIC ACID, PLASMA: Lactic Acid, Venous: 1.6 mmol/L (ref 0.5–1.9)

## 2020-02-27 MED ORDER — GENTAMICIN SULFATE 40 MG/ML IJ SOLN
5.0000 mg/kg | INTRAVENOUS | Status: DC
  Filled 2020-02-27: qty 13.25

## 2020-02-27 MED ORDER — SODIUM CHLORIDE 0.9% IV SOLUTION: Freq: Once | INTRAVENOUS | Status: DC

## 2020-02-27 MED ORDER — GENTAMICIN SULFATE 40 MG/ML IJ SOLN
5.0000 mg/kg | INTRAVENOUS | Status: DC
  Administered 2020-02-27 – 2020-02-29 (×3): 530 mg via INTRAVENOUS
  Filled 2020-02-27 (×4): qty 13.25

## 2020-02-27 MED ORDER — SODIUM CHLORIDE 0.9% IV SOLUTION: Freq: Once | INTRAVENOUS | Status: AC

## 2020-02-27 MED ORDER — CLINDAMYCIN PHOSPHATE 900 MG/50ML IV SOLN
900.0000 mg | Freq: Three times a day (TID) | INTRAVENOUS | Status: DC
  Administered 2020-02-27 – 2020-03-01 (×8): 900 mg via INTRAVENOUS
  Filled 2020-02-27 (×10): qty 50

## 2020-02-27 MED ORDER — DIPHENHYDRAMINE HCL 50 MG/ML IJ SOLN: 25.0000 mg | Freq: Once | INTRAMUSCULAR | Status: DC

## 2020-02-27 MED ORDER — SODIUM CHLORIDE 0.9 % IV BOLUS
500.0000 mL | INTRAVENOUS | Status: DC
  Administered 2020-02-28: 500 mL via INTRAVENOUS

## 2020-02-27 MED ORDER — SODIUM CHLORIDE 0.9 % IV BOLUS
1000.0000 mL | INTRAVENOUS | Status: DC
  Administered 2020-02-27 – 2020-02-28 (×2): 1000 mL via INTRAVENOUS

## 2020-02-27 MED ORDER — GENTAMICIN SULFATE 40 MG/ML IJ SOLN: 5.0000 mg/kg | INTRAVENOUS | Status: DC

## 2020-02-27 MED ORDER — IBUPROFEN 800 MG PO TABS
800.0000 mg | ORAL_TABLET | Freq: Three times a day (TID) | ORAL | Status: DC
  Administered 2020-02-27: 800 mg via ORAL
  Filled 2020-02-27: qty 1

## 2020-02-27 MED ORDER — SODIUM CHLORIDE 0.9 % IV BOLUS
500.0000 mL | INTRAVENOUS | Status: DC
  Administered 2020-02-27 – 2020-02-29 (×2): 500 mL via INTRAVENOUS

## 2020-02-27 MED ORDER — SODIUM CHLORIDE 0.9 % IV BOLUS: 500.0000 mL | INTRAVENOUS | Status: DC

## 2020-02-27 MED ORDER — SODIUM CHLORIDE 0.9 % IV SOLN
2.0000 g | Freq: Four times a day (QID) | INTRAVENOUS | Status: DC
  Administered 2020-02-27 – 2020-02-28 (×4): 2 g via INTRAVENOUS
  Filled 2020-02-27 (×3): qty 2
  Filled 2020-02-27: qty 2000
  Filled 2020-02-27: qty 2

## 2020-02-27 MED ORDER — FERROUS SULFATE 325 (65 FE) MG PO TABS
325.0000 mg | ORAL_TABLET | Freq: Two times a day (BID) | ORAL | Status: DC
  Administered 2020-02-27 – 2020-03-01 (×6): 325 mg via ORAL
  Filled 2020-02-27 (×6): qty 1

## 2020-02-27 MED ORDER — SODIUM CHLORIDE 0.9 % IV SOLN: INTRAVENOUS | Status: DC

## 2020-02-27 MED ORDER — DIPHENHYDRAMINE HCL 25 MG PO CAPS
25.0000 mg | ORAL_CAPSULE | Freq: Once | ORAL | Status: AC
  Administered 2020-02-27: 25 mg via ORAL
  Filled 2020-02-27: qty 1

## 2020-02-27 NOTE — Progress Notes (Signed)
Spoke with Dr Amado Nash regarding Pre Blood transfusion vitals, 102.8 temp and elevated HR, she said to not go through with the transfusion at this point, Dr Cherly Hensen will reevaluate.

## 2020-02-27 NOTE — Progress Notes (Signed)
Pharmacy Antibiotic Note  Paula Cross is a 29 y.o. female admitted on 02/25/2020 with preeclampsia with HA and AKI. S/P LTCS on 5/20 with foul odor of amnionitic fluid. Pt is febrile on Cefoxitin 2 gram IV q6h since delivery and COVID +. Pharmacy has been consulted for Gentamicin dosing.  Plan: Gentamicin 530mg  (5mg /kg using ABW: 105.7kg) IV q24h Per ID request, pt to receive NS bolus pre and post Gent infusion. Will follow SCr closely due to elevation  Height: 5\' 11"  (180.3 cm) Weight: (!) 158 kg (348 lb 5.2 oz) IBW/kg (Calculated) : 70.8  ABW/Dosing Weight: 105.7kg  Temp (24hrs), Avg:100.3 F (37.9 C), Min:97.7 F (36.5 C), Max:103 F (39.4 C)  Recent Labs  Lab 02/23/20 0644 02/23/20 0644 02/24/20 0909 02/25/20 1134 02/26/20 0318 02/26/20 0630 02/27/20 0835 02/27/20 1756  WBC 10.8*   < > 10.5 7.6 8.0 8.8 5.7  --   CREATININE 0.80  --  0.88 1.14*  --  1.48* 1.41*  --   LATICACIDVEN  --   --   --   --   --   --   --  1.6   < > = values in this interval not displayed.    Estimated Creatinine Clearance: 99.1 mL/min (A) (by C-G formula based on SCr of 1.41 mg/dL (H)).    No Known Allergies  Antimicrobials this admission: Cefoxitin 2 gram IV q6h 5/20 >> 5/21  Dose adjustments this admission: Toradol and Ibuprofen discontiued  Microbiology results: 5/21  BCx:  X 2   Thank you for allowing pharmacy to be a part of this patient's care.  6/20 02/27/2020 6:47 PM

## 2020-02-27 NOTE — Progress Notes (Signed)
Pt denies sob/cp, n/v - tol po; not really ambulating today, has been lying in bed since last night, c/o LE edema; very upset - says mad at pediatricians - nurse in room and then explained that just informed of eval for clavicular fracture for one of the babies - babies have also been followed for sugars and bili; pt says she just wants to go home, doesn't want to be here forever Normal lochia, denies lightheadedness/dizziness; pain not well controlled +shakes, but denies chills/sweats, no c/o fever   Vs: last 24 hrs 112-143/50s-80s, 16-17, 80-90s, sats 100% RA   Intake/Output Summary (Last 24 hours) at 02/27/2020 1614 Last data filed at 02/27/2020 1250 Gross per 24 hour  Intake 4025 ml  Output 5225 ml  Net -1200 ml   A&0x3 Tachy, reg rhythm ctab Abd: soft, nt, nd; +bs; obese; provena dressing in place LE : +1 pitting edema in thighs, +2 LE edema, nt  CBC Latest Ref Rng & Units 02/27/2020 02/26/2020 02/26/2020  WBC 4.0 - 10.5 K/uL 5.7 8.8 8.0  Hemoglobin 12.0 - 15.0 g/dL 6.7(LL) 7.4(L) 7.5(L)  Hematocrit 36.0 - 46.0 % 21.4(L) 23.7(L) 24.0(L)  Platelets 150 - 400 K/uL 169 145(L) 127(L)   CMP Latest Ref Rng & Units 02/27/2020 02/26/2020 02/25/2020  Glucose 70 - 99 mg/dL 85(I) 627(O) 90  BUN 6 - 20 mg/dL 9 9 6   Creatinine 0.44 - 1.00 mg/dL ) 3.50(K) 9.38(H)  Sodium 135 - 145 mmol/L 139 133(L) 136  Potassium 3.5 - 5.1 mmol/L 3.4(L) 3.1(L) 3.2(L)  Chloride 98 - 111 mmol/L 109 104 106  CO2 22 - 32 mmol/L 23 18(L) 16(L)  Calcium 8.9 - 10.3 mg/dL 8.0(L) 8.2(L) 8.6(L)  Total Protein 6.5 - 8.1 g/dL 8.29(H) 4.9(L) 6.5  Total Bilirubin 0.3 - 1.2 mg/dL 0.6 0.5 0.7  Alkaline Phos 38 - 126 U/L 120 139(H) 177(H)  AST 15 - 41 U/L 57(H) 29 29  ALT 0 - 44 U/L 26 19 19      A/P: pod 0 s/p 1ltcs for arrest of dilation; di/di twins 1. Severe pre-e - bps normal to mild range, will keep monitoring; elevated creatinine today still but decreasing from yesterday, slight increase in ast today (57) with  nml alt - plan repeat cmp tomorrow 2. Severe anemia /acute blood loss- pt is asymptomatic, now afebrile for >24 hrs and will plan to transfuse [facility] today; I have reviewed r/b/a with pt and she agrees with transfusion 3. chorio - today is day 2/2 of mefoxin 4. covid positive; suspect shakes she is having is related to this; monitor closely and provide symptomatic treatment  5. I/o- now with good diuresis, will d/c foley catheter but contin plan for strict I/o 6. Post op - reviewed appropriate pain control and also importance of ambulating; pt understands 7. dvt prophylaxis - lovenox 8.  LE edema - should improve with diuresis; also patient has been in bed all day/night and dependent edema in thights suspected based on how she is laying in bed; encourage ambulation and monitor if edema persists 9. Elevated lft - repeat cmp in am 10 elevated creatinine - repeat in am, suspect trend downward

## 2020-02-27 NOTE — Progress Notes (Signed)
On call MD Called by RN  Later this afternoon with update regarding Paula Cross's care: specifically temp 102 despite around the clock tylenol. Pt also has been on cefoxitin since delivery. She is POD #1 C/S and has had blood transfusion prod on hold due to her temp. Per RN, pt was fatigue but was not SOB. She is Covid 19 positive on routine admit screen.  Her chart was reviewed by me and per op note intraop findings of foul amniotic fluid odor resulted in presumptive dx of chorioamnionitis. Pt also had PPH.  Also reviewed VS, labs,  notes from Dr Amado Nash. Significant findings of tachycardia , wbc trending down, increased RR and continued temp, result in decision to involve ID as well as check lactic acid for poss sepsis and change antibiotics. Repeat CBC with diff , BC x2, lactic acid level ordered. Dr Drue Second ( ID consult on call) responded). Paula Cross was called by me to inform her of the plan of care and concerns. ID consult  note appreciated

## 2020-02-27 NOTE — Progress Notes (Signed)
Spoke with lab, CBC and CMP was due at 0500.  Lab [facility], stated that she was catching up on STAT labs, and that she will be here to draw the labs in the upcoming minutes

## 2020-02-27 NOTE — Progress Notes (Signed)
Labs reviewed BMP Latest Ref Rng & Units 02/27/2020 02/27/2020 02/26/2020  Glucose 70 - 99 mg/dL 84 14(N) 829(F)  BUN 6 - 20 mg/dL 7 9 9   Creatinine 0.44 - 1.00 mg/dL ) 6.21(H) 0.86(V)  Sodium 135 - 145 mmol/L 140 139 133(L)  Potassium 3.5 - 5.1 mmol/L 3.1(L) 3.4(L) 3.1(L)  Chloride 98 - 111 mmol/L 109 109 104  CO2 22 - 32 mmol/L 22 23 18(L)  Calcium 8.9 - 10.3 mg/dL 7.9(L) 8.0(L) 8.2(L)   CBC Latest Ref Rng & Units 02/27/2020 02/27/2020 02/26/2020  WBC 4.0 - 10.5 K/uL 6.6 5.7 8.8  Hemoglobin 12.0 - 15.0 g/dL 4.8(LL) 6.7(LL) 7.4(L)  Hematocrit 36.0 - 46.0 % 14.4(L) 21.4(L) 23.7(L)  Platelets 150 - 400 K/uL 236 169 145(L)  BP (!) 130/56 (BP Location: Left Arm)   Pulse (!) 134   Temp 99.2 F (37.3 C) (Oral)   Resp 18   Ht 5\' 11"  (1.803 m)   Wt (!) 158 kg   LMP 06/18/2019   SpO2 100%   Breastfeeding Unknown   BMI 48.58 kg/m    Anemia due to acute blood loss Symptomatic but not clinically bleeding more. Suspect worsening hgb with iatrogenic multiple lab draws Will proceed with blood transfusion ( 4units PRBC) and 2 U FFP.  Hypokalemia noted. Suboptimally repleted with the 2 doses of K dur given. Will defer re order of potassium supplement to after blood transfusion which may increase the level by itself IV hydrate Lactic acid <2 therefore will not need to initiate sepsis protocol.  Received IV amp/clinda. Await gentamicin

## 2020-02-27 NOTE — Consult Note (Addendum)
Formal note to follow:  29yo F who is G1P0 at 21 w with twin pregnancy presented with HA and nausea found to have preeclampsia with HA and AKI. She was also incidentally found to be covid-19 positive, asymptomatic.  She underwent induction but ultimately had to go to c-section due to arrested dilatation. Upon  Low transverse incision, foul odor noted to amniotic fluid concerning for chorioamnionitis. In addition to surgery findings, she had fevers of 101F. She was started of cefoxitin however still having rigors on 5/21 up to 103F. With elevated HR.   Agree with plan to: - give IVF - NS, for sensible loss from fever - start IV amp, gent, and clindamycin (unable to do metronidazole due to BF) - will follow Cr closely knowing it is elevated - will give bolus pre and post gent  - avoid toradol and ibuprofen for the time being while on gent - recommend to get cbc with diff, lactic acid, and bmp - if LA> 2, please contact pulm critical care  covid-19 management = will watch respiratory status to discuss if need to give antivirals. Her CT value is at 18, suggestive of new infection. Possibly candidate for mAb after she is recovered from chorioamnionitis. Continue on contact-airborne isolation  Paula Trinkle B. Drue Second MD MPH [facility] [phone]

## 2020-02-28 LAB — CBC
HCT: 28.8 % — ABNORMAL LOW (ref 36.0–46.0)
Hemoglobin: 9.6 g/dL — ABNORMAL LOW (ref 12.0–15.0)
MCH: 27.1 pg (ref 26.0–34.0)
MCHC: 33.3 g/dL (ref 30.0–36.0)
MCV: 81.4 fL (ref 80.0–100.0)
Platelets: 182 10*3/uL (ref 150–400)
RBC: 3.54 MIL/uL — ABNORMAL LOW (ref 3.87–5.11)
RDW: 18.6 % — ABNORMAL HIGH (ref 11.5–15.5)
WBC: 3.7 10*3/uL — ABNORMAL LOW (ref 4.0–10.5)
nRBC: 0 % (ref 0.0–0.2)

## 2020-02-28 LAB — BASIC METABOLIC PANEL
Anion gap: 9 (ref 5–15)
BUN: 9 mg/dL (ref 6–20)
CO2: 24 mmol/L (ref 22–32)
Calcium: 7.2 mg/dL — ABNORMAL LOW (ref 8.9–10.3)
Chloride: 107 mmol/L (ref 98–111)
Creatinine, Ser: 1.18 mg/dL — ABNORMAL HIGH (ref 0.44–1.00)
GFR calc Af Amer: >60 mL/min (ref >60)
GFR calc non Af Amer: >60 mL/min (ref >60)
Glucose, Bld: 84 mg/dL (ref 70–99)
Potassium: 2.9 mmol/L — ABNORMAL LOW (ref 3.5–5.1)
Sodium: 140 mmol/L (ref 135–145)

## 2020-02-28 MED ORDER — POTASSIUM CHLORIDE CRYS ER 20 MEQ PO TBCR
40.0000 meq | EXTENDED_RELEASE_TABLET | Freq: Two times a day (BID) | ORAL | Status: AC
  Administered 2020-02-28 – 2020-03-02 (×6): 40 meq via ORAL
  Filled 2020-02-28 (×6): qty 2

## 2020-02-28 MED ORDER — SODIUM CHLORIDE 0.9 % IV SOLN
2.0000 g | INTRAVENOUS | Status: DC
  Administered 2020-02-28 – 2020-03-01 (×10): 2 g via INTRAVENOUS
  Filled 2020-02-28: qty 2
  Filled 2020-02-28 (×3): qty 2000
  Filled 2020-02-28 (×7): qty 2
  Filled 2020-02-28 (×2): qty 2000
  Filled 2020-02-28 (×2): qty 2

## 2020-02-28 NOTE — Progress Notes (Signed)
SVD: primary  S:  Pt reports feeling better/ Tolerating po/ Voiding without problems/ No n/v/ Bleeding is light/ Pain controlled withnarcotic analgesics including roxicet S/p 4 Units of PRBC, 1u FFP A/G/C   O:  A & O x 3 / VS: Blood pressure 123/74, pulse (!) 111, temperature (!) 102.1 F (38.9 C), temperature source Oral, resp. rate (!) 24, height 5\' 11"  (1.803 m), weight (!) 158 kg, last menstrual period 06/18/2019, SpO2 99 %, unknown if currently breastfeeding.  LABS:  Results for orders placed or performed during the hospital encounter of 02/25/20 (from the past 24 hour(s))  Prepare RBC (crossmatch)     Status: None   Collection Time: 02/27/20 12:26 PM  Result Value Ref Range   Order Confirmation      ORDER PROCESSED BY BLOOD BANK Performed at [facility], [address], [place], [place]   Lactic acid, plasma     Status: None   Collection Time: 02/27/20  5:56 PM  Result Value Ref Range   Lactic Acid, Venous 1.6 0.5 - 1.9 mmol/L  Culture, blood (routine x 2)     Status: None (Preliminary result)   Collection Time: 02/27/20  5:56 PM   Specimen: BLOOD  Result Value Ref Range   Specimen Description BLOOD SITE NOT SPECIFIED    Special Requests      BOTTLES DRAWN AEROBIC AND ANAEROBIC Blood Culture results may not be optimal due to an inadequate volume of blood received in culture bottles   Culture      NO GROWTH < 12 HOURS Performed at [facility], [address], [place], [place]    Report Status PENDING   Culture, blood (routine x 2)     Status: None (Preliminary result)   Collection Time: 02/27/20  5:56 PM   Specimen: BLOOD  Result Value Ref Range   Specimen Description BLOOD SITE NOT SPECIFIED    Special Requests      BOTTLES DRAWN AEROBIC AND ANAEROBIC Blood Culture results may not be optimal due to an inadequate volume of blood received in culture bottles   Culture      NO GROWTH < 12 HOURS Performed at [facility], [address], [place], [place]    Report Status PENDING   CBC with Differential     Status: Abnormal   Collection Time: 02/27/20  5:56 PM  Result Value Ref Range   WBC 6.6 4.0 - 10.5 K/uL   RBC 1.87 (L) 3.87 - 5.11 MIL/uL   Hemoglobin 4.8 (LL) 12.0 - 15.0 g/dL   HCT 02/29/20 (L) 00.7 - 62.2 %   MCV 77.0 (L) 80.0 - 100.0 fL   MCH 25.7 (L) 26.0 - 34.0 pg   MCHC 33.3 30.0 - 36.0 g/dL   RDW 63.3 (H) 35.4 - 56.2 %   Platelets 236 150 - 400 K/uL   nRBC 0.0 0.0 - 0.2 %   Neutrophils Relative % 88 %   Neutro Abs 5.8 1.7 - 7.7 K/uL   Lymphocytes Relative 6 %   Lymphs Abs 0.4 (L) 0.7 - 4.0 K/uL   Monocytes Relative 5 %   Monocytes Absolute 0.3 0.1 - 1.0 K/uL   Eosinophils Relative 0 %   Eosinophils Absolute 0.0 0.0 - 0.5 K/uL   Basophils Relative 0 %   Basophils Absolute 0.0 0.0 - 0.1 K/uL   Immature Granulocytes 1 %   Abs Immature Granulocytes 0.05 0.00 - 0.07 K/uL  Basic metabolic panel     Status: Abnormal   Collection Time: 02/27/20  6:31 PM  Result Value Ref Range   Sodium 140 135 - 145 mmol/L   Potassium 3.1 (L) 3.5 - 5.1 mmol/L   Chloride 109 98 - 111 mmol/L   CO2 22 22 - 32 mmol/L   Glucose, Bld 84 70 - 99 mg/dL   BUN 7 6 - 20 mg/dL   Creatinine, Ser 1.20 (H) 0.44 - 1.00 mg/dL   Calcium 7.9 (L) 8.9 - 10.3 mg/dL   GFR calc non Af Amer >60 >60 mL/min   GFR calc Af Amer >60 >60 mL/min   Anion gap 9 5 - 15  Prepare RBC (crossmatch)     Status: None   Collection Time: 02/27/20  7:05 PM  Result Value Ref Range   Order Confirmation      ORDER PROCESSED BY BLOOD BANK Performed at [facility], [place] [address] [place], [place] [address]   Prepare fresh frozen plasma     Status: None (Preliminary result)   Collection Time: 02/27/20  7:07 PM  Result Value Ref Range   Unit Number [id number]    Blood Component Type THAWED PLASMA    Unit division 00    Status of Unit ISSUED    Transfusion Status OK TO TRANSFUSE    Unit Number [id number]    Blood Component Type  THAWED PLASMA    Unit division 00    Status of Unit ISSUED    Transfusion Status      OK TO TRANSFUSE Performed at [facility] [address] [place], [place] [address]   CBC     Status: Abnormal   Collection Time: 02/28/20 11:45 AM  Result Value Ref Range   WBC 3.7 (L) 4.0 - 10.5 K/uL   RBC 3.54 (L) 3.87 - 5.11 MIL/uL   Hemoglobin 9.6 (L) 12.0 - 15.0 g/dL   HCT 28.8 (L) 36.0 - 46.0 %   MCV 81.4 80.0 - 100.0 fL   MCH 27.1 26.0 - 34.0 pg   MCHC 33.3 30.0 - 36.0 g/dL   RDW 18.6 (H) 11.5 - 15.5 %   Platelets 182 150 - 400 K/uL   nRBC 0.0 0.0 - 0.2 %    I&O: I/O last 3 completed shifts: In: 7328.4 [P.O.:2800; I.V.:1216; Blood:1512.4; IV Piggyback:1800] Out: 8425 [Urine:8425]   Total I/O In: 8185 [P.O.:960; Blood:315] Out: 2500 [Urine:2500]  Lungs: chest clear, no wheezing, rales, normal symmetric air entry  Heart: tachycardia  Abdomen:obes soft uterus diff to feel fundus. Non tender Wound vac on incision  Perineum: is normal  Lochia: light  Extremities:edema tr+   CBC Latest Ref Rng & Units 02/28/2020 02/27/2020 02/27/2020  WBC 4.0 - 10.5 K/uL 3.7(L) 6.6 5.7  Hemoglobin 12.0 - 15.0 g/dL 9.6(L) 4.8(LL) 6.7(LL)  Hematocrit 36.0 - 46.0 % 28.8(L) 14.4(L) 21.4(L)  Platelets 150 - 400 K/uL 182 236 169  BC pending  A/P: POD # 2/PPD # 2/ [id number] Covid 19 positive  PPH with resultant  Severe anemia s/p IV PRBC, FFP Chorioamnionitis on A/G/C(<24 hr) prev on Mefoxitin. Still note spike but have not had enough time to see effect( full) Morbid obesity on lovenox POD #2 s/p C/S hypokalemia  Doing better  Continue routine post partum orders  Cont IV antibiotics. Change Ampicillin to q 4 hrs. ? Enough dose for gentamicin Complete 2nd FFP Will add iron supplementation Await repeat CBC, BMP( check K)

## 2020-02-28 NOTE — Lactation Note (Addendum)
This note was copied from a baby's chart. Lactation Consultation Note  Patient Name: Paula Cross FSELT'R Date: 02/28/2020  P2, 52 hour LPTI twins. Per RN Lelon Mast) mom had blood transfusions of blood and plasma she used DEBP once last night , very tired and has been using formula throughout the night. Mom prefers  to be seen by [facility] services later today wants to rest for now.    Maternal Data    Feeding Feeding Type: Bottle Fed - Formula Nipple Type: Slow - flow  LATCH Score                   Interventions    Lactation Tools Discussed/Used     Consult Status      Danelle Earthly 02/28/2020, 6:16 AM

## 2020-02-28 NOTE — Plan of Care (Signed)
  Problem: Clinical Measurements: Goal: Ability to maintain clinical measurements within normal limits will improve Note: Notified Dr. Cherly Hensen of potassium level results from today's labs. Dr. Cherly Hensen ordered po potassium twice a day for three days. See orders. Paula Cross, [facility] [place]

## 2020-02-28 NOTE — Consult Note (Signed)
[facility]  Total days of antibiotics 3       Reason for Consult:  chorioamnionitis  Referring Physician: cousins  Principal Problem:   Postpartum care following cesarean delivery 5/20 Active Problems:   Dichorionic diamniotic twin pregnancy in second trimester   Normal labor and delivery   Severe preeclampsia, third trimester   Delivery by emergency cesarean    HPI: Paula Cross is a 29 y.o. female who is G1P0 at 44 w with twin pregnancy presented with HA and nausea and flu like illness that started last Wednesday however she started feeling quite poorly this past Monday on 5/17 and would come to the ED for evaluation for the next several days. It does not appear that covid-19 testing was done prior to this admission on 5/19. She states that she has been bed bound due to incompete cervix since February however recently they went out to several places since she was relinquished from bedbound status. On admit on 5/19,she was diagnosed with  preeclampsia with HA and AKI as well as covid-19 positive. No sick contacts. Her husband is vaccinated, and she was going to wait to be vaccinated until children were born.   She underwent induction but ultimately had to go to c-section due to arrested dilatation. Upon  Low transverse incision, foul odor noted to amniotic fluid concerning for chorioamnionitis. In addition to surgery findings, she had fevers of 101F. She was started of cefoxitin however still having rigors on 5/21 up to 103F. With elevated HR.  She delivered boy-girl twins who are#6 lb and doing well. Staying in the room with patient.   Based upon her symptoms, she suspects that she has had symptoms since 5/12.  Last night repeat CBC showed very low hgb for which she received FFP and 4U RBC. In addition was given IVF - NS, for sensible loss from fever. abtx of cefoxitin was changed to IV amp, gent, and clindamycin (unable to do metronidazole due to  BF). Cr shows slight improvement. NSAIDs discontinued and her LA < 2.   She states that she is not having rigors this afternoon like she had overnight, just chills. Feeling some swelling in right hand previous iv. Has wound vac in place from csection. Mild tenderness at incision site. Past Medical History:  Diagnosis Date  . Medical history non-contributory   . Severe preeclampsia, third trimester 02/25/2020    Allergies: No Known Allergies   MEDICATIONS: . acetaminophen  1,000 mg Oral Q6H  . docusate sodium  100 mg Oral Daily  . enoxaparin (LOVENOX) injection  0.5 mg/kg Subcutaneous Q24H  . famotidine  10 mg Oral BID  . ferrous sulfate  325 mg Oral BID WC  . prenatal multivitamin  1 tablet Oral Q1200  . scopolamine  1 patch Transdermal Once  . senna-docusate  2 tablet Oral Q24H  . simethicone  80 mg Oral TID PC  . simethicone  80 mg Oral Q24H  . Tdap  0.5 mL Intramuscular Once    Social History   Tobacco Use  . Smoking status: Never Smoker  . Smokeless tobacco: Never Used  Substance Use Topics  . Alcohol use: Never  . 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  History reviewed. No pertinent family history.  Review of Systems -  12 point ros reviewed, negative except for fever, low abdominal pain OBJECTIVE: Temp:  [98.6 F (37 C)-103 F (39.4 C)]  101.5 F (38.6 C) (05/22 1310) Pulse Rate:  [101-134] 112 (05/22 1310) Resp:  [17-24] 22 (05/22 1310) BP: (94-130)/(46-83) 129/83 (05/22 1310) SpO2:  [97 %-100 %] 97 % (05/22 1310) Physical Exam  Constitutional:  oriented to person, place, and time. appears well-developed and well-nourished. No distress.  HENT: [place]/AT, PERRLA, no scleral icterus Mouth/Throat: Oropharynx is clear and moist. No oropharyngeal exudate.  Cardiovascular: Normal rate, regular rhythm and normal heart sounds. Exam reveals no gallop and no friction rub.  No murmur heard.  Pulmonary/Chest: Effort  normal and breath sounds normal. No respiratory distress.  has no wheezes.  Neck = supple, no nuchal rigidity Abdominal: Soft. Bowel sounds are normal.  exhibits no distension. There is no tenderness. Wound vac in place. Non tender Lymphadenopathy: no cervical adenopathy. No axillary adenopathy Neurological: alert and oriented to person, place, and time.  Skin: Skin is warm and dry. No rash noted. No erythema.  Psychiatric: a normal mood and affect.  behavior is normal.    LABS: Results for orders placed or performed during the hospital encounter of 02/25/20 (from the past 48 hour(s))  CBC     Status: Abnormal   Collection Time: 02/27/20  8:35 AM  Result Value Ref Range   WBC 5.7 4.0 - 10.5 K/uL   RBC 2.67 (L) 3.87 - 5.11 MIL/uL   Hemoglobin 6.7 (LL) 12.0 - 15.0 g/dL    Comment: REPEATED TO VERIFY THIS CRITICAL RESULT HAS VERIFIED AND BEEN CALLED TO S JARRELO,RN BY KIM COLVIN ON 05 21 2021 AT 0935, AND HAS BEEN READ BACK.     HCT 21.4 (L) 36.0 - 46.0 %   MCV 80.1 80.0 - 100.0 fL   MCH 25.1 (L) 26.0 - 34.0 pg   MCHC 31.3 30.0 - 36.0 g/dL   RDW 16.119.9 (H) 09.611.5 - 04.515.5 %   Platelets 169 150 - 400 K/uL   nRBC 0.0 0.0 - 0.2 %    Comment: Performed at [facility], [address] [place], [place] [address]  Comprehensive metabolic panel     Status: Abnormal   Collection Time: 02/27/20  8:35 AM  Result Value Ref Range   Sodium 139 135 - 145 mmol/L   Potassium 3.4 (L) 3.5 - 5.1 mmol/L   Chloride 109 98 - 111 mmol/L   CO2 23 22 - 32 mmol/L   Glucose, Bld 63 (L) 70 - 99 mg/dL    Comment: Glucose reference range applies only to samples taken after fasting for at least 8 hours.   BUN 9 6 - 20 mg/dL   Creatinine, Ser 1.911.41 (H) 0.44 - 1.00 mg/dL   Calcium 8.0 (L) 8.9 - 10.3 mg/dL   Total Protein 4.4 (L) 6.5 - 8.1 g/dL   Albumin 1.7 (L) 3.5 - 5.0 g/dL   AST 57 (H) 15 - 41 U/L   ALT 26 0 - 44 U/L   Alkaline Phosphatase 120 38 - 126 U/L   Total Bilirubin 0.6 0.3 - 1.2 mg/dL   GFR calc  non Af Amer 51 (L) >60 mL/min   GFR calc Af Amer 59 (L) >60 mL/min   Anion gap 7 5 - 15    Comment: Performed at [facility], [address] [place], [place] [address]  Prepare RBC (crossmatch)     Status: None   Collection Time: 02/27/20 12:26 PM  Result Value Ref Range   Order Confirmation      ORDER PROCESSED BY BLOOD BANK Performed at [facility], [address] [place], [place] [address]   Lactic acid, plasma     Status: None   Collection Time: 02/27/20  5:56 PM  Result Value Ref Range   Lactic Acid, Venous 1.6 0.5 - 1.9 mmol/L    Comment: Performed at [facility], [address], [place], [place] [address]  Culture, blood (routine x 2)     Status: None (Preliminary result)   Collection Time: 02/27/20  5:56 PM   Specimen: BLOOD  Result Value Ref Range   Specimen Description BLOOD SITE NOT SPECIFIED    Special Requests      BOTTLES DRAWN AEROBIC AND ANAEROBIC Blood Culture results may not be optimal due to an inadequate volume of blood received in culture bottles   Culture      NO GROWTH < 12 HOURS Performed at [facility], [address], [place], [place] [address]    Report Status PENDING   Culture, blood (routine x 2)     Status: None (Preliminary result)   Collection Time: 02/27/20  5:56 PM   Specimen: BLOOD  Result Value Ref Range   Specimen Description BLOOD SITE NOT SPECIFIED    Special Requests      BOTTLES DRAWN AEROBIC AND ANAEROBIC Blood Culture results may not be optimal due to an inadequate volume of blood received in culture bottles   Culture      NO GROWTH < 12 HOURS Performed at [facility], [address] [place], [place] [address]    Report Status PENDING   CBC with Differential     Status: Abnormal   Collection Time: 02/27/20  5:56 PM  Result Value Ref Range   WBC 6.6 4.0 - 10.5 K/uL   RBC 1.87 (L) 3.87 - 5.11 MIL/uL   Hemoglobin 4.8 (LL) 12.0 - 15.0 g/dL    Comment: REPEATED TO VERIFY Reticulocyte Hemoglobin  testing may be clinically indicated, consider ordering this additional test [id number] THIS CRITICAL RESULT HAS VERIFIED AND BEEN CALLED TO STEPHANIE JARRELL RN BY MARSHA GARRETT ON 05 21 2021 AT 1853, AND HAS BEEN READ BACK.     HCT 14.4 (L) 36.0 - 46.0 %   MCV 77.0 (L) 80.0 - 100.0 fL   MCH 25.7 (L) 26.0 - 34.0 pg   MCHC 33.3 30.0 - 36.0 g/dL   RDW 86.5 (H) 78.4 - 69.6 %   Platelets 236 150 - 400 K/uL   nRBC 0.0 0.0 - 0.2 %   Neutrophils Relative % 88 %   Neutro Abs 5.8 1.7 - 7.7 K/uL   Lymphocytes Relative 6 %   Lymphs Abs 0.4 (L) 0.7 - 4.0 K/uL   Monocytes Relative 5 %   Monocytes Absolute 0.3 0.1 - 1.0 K/uL   Eosinophils Relative 0 %   Eosinophils Absolute 0.0 0.0 - 0.5 K/uL   Basophils Relative 0 %   Basophils Absolute 0.0 0.0 - 0.1 K/uL   Immature Granulocytes 1 %   Abs Immature Granulocytes 0.05 0.00 - 0.07 K/uL    Comment: Performed at [facility], [address], [place], [place] [address]  Basic metabolic panel     Status: Abnormal   Collection Time: 02/27/20  6:31 PM  Result Value Ref Range   Sodium 140 135 - 145 mmol/L   Potassium 3.1 (L) 3.5 - 5.1 mmol/L   Chloride 109 98 - 111 mmol/L   CO2 22 22 - 32 mmol/L   Glucose, Bld 84  70 - 99 mg/dL    Comment: Glucose reference range applies only to samples taken after fasting for at least 8 hours.   BUN 7 6 - 20 mg/dL   Creatinine, Ser 1.20 (H) 0.44 - 1.00 mg/dL   Calcium 7.9 (L) 8.9 - 10.3 mg/dL   GFR calc non Af Amer >60 >60 mL/min   GFR calc Af Amer >60 >60 mL/min   Anion gap 9 5 - 15    Comment: Performed at [facility] [address] [place], [place] [address]  Prepare RBC (crossmatch)     Status: None   Collection Time: 02/27/20  7:05 PM  Result Value Ref Range   Order Confirmation      ORDER PROCESSED BY BLOOD BANK Performed at [facility], [place] [address] [place], [place] [address]   Prepare fresh frozen plasma     Status: None (Preliminary result)   Collection Time: 02/27/20  7:07  PM  Result Value Ref Range   Unit Number [id number]    Blood Component Type THAWED PLASMA    Unit division 00    Status of Unit ISSUED    Transfusion Status OK TO TRANSFUSE    Unit Number [id number]    Blood Component Type THAWED PLASMA    Unit division 00    Status of Unit ISSUED    Transfusion Status      OK TO TRANSFUSE Performed at [place] [address] [place], [place] [address]   Basic metabolic panel     Status: Abnormal   Collection Time: 02/28/20 11:45 AM  Result Value Ref Range   Sodium 140 135 - 145 mmol/L   Potassium 2.9 (L) 3.5 - 5.1 mmol/L   Chloride 107 98 - 111 mmol/L   CO2 24 22 - 32 mmol/L   Glucose, Bld 84 70 - 99 mg/dL    Comment: Glucose reference range applies only to samples taken after fasting for at least 8 hours.   BUN 9 6 - 20 mg/dL   Creatinine, Ser 1.18 (H) 0.44 - 1.00 mg/dL   Calcium 7.2 (L) 8.9 - 10.3 mg/dL   GFR calc non Af Amer >60 >60 mL/min   GFR calc Af Amer >60 >60 mL/min   Anion gap 9 5 - 15    Comment: Performed at [facility] [address] [place], [place] [address]  CBC     Status: Abnormal   Collection Time: 02/28/20 11:45 AM  Result Value Ref Range   WBC 3.7 (L) 4.0 - 10.5 K/uL   RBC 3.54 (L) 3.87 - 5.11 MIL/uL   Hemoglobin 9.6 (L) 12.0 - 15.0 g/dL    Comment: REPEATED TO VERIFY POST TRANSFUSION SPECIMEN    HCT 28.8 (L) 36.0 - 46.0 %   MCV 81.4 80.0 - 100.0 fL   MCH 27.1 26.0 - 34.0 pg   MCHC 33.3 30.0 - 36.0 g/dL   RDW 18.6 (H) 11.5 - 15.5 %   Platelets 182 150 - 400 K/uL   nRBC 0.0 0.0 - 0.2 %    Comment: Performed at [facility], [place] [address] [place], [place] [address]    MICRO: reveiwed IMAGING: No results found.  Assessment/Plan:  29yo F with mild covid-19 and chorioamnionitis  - continue on amp/gent/clindamycin for now. - continue with IVF hydration - will follow cr closely, appears that her AKI has improved - will follow blood cx to see if can narrow abtx -  if still  febrile tomorrow, recommend to do abd/pelvis CT looking for abscess formation - need at least 24hr of being afebrile to switch to oral abtx  Mild covid-19 = she is not hypoxic. Unfortunately her symptoms may have started >10d thus not a candidate for mAb.  Leukopenia = possibly due to sepsis vs. covid-19 illness. Will continue to monitor  Acute blood loss from csection= responded adequately to blood trsf. hgb up to 9 -baseline of 11.  Duke Salvia Drue Second MD MPH [facility] [phone]

## 2020-02-28 NOTE — Progress Notes (Signed)
   02/28/20 0240  Lochia  Color Rubra  Amount Scant  Odor None  Clots Large (fell out in bedside commode)  Clot Size softball size (saved in graduated cylinder in bathroom)

## 2020-02-28 NOTE — Progress Notes (Signed)
Mikle Bosworth notified of patient' status, recommendation was to transfer patient to [facility] . Dr. Cherly Hensen called and notified of recommendation and is ok with patient transferring to [facility] if needed. House Coverage B. Jones aware of plan of care for patient. RN Newell Coral aware of plan to keep patient in 48 for now unless patient becomes unstable. RN told to keep a close eye on patient and to monitor oxygen saturations every hour per T. Willis. RN told to call charge RN with CBC results when resulted by lab to deteremine  Patient's next plan of care.

## 2020-02-28 NOTE — Lactation Note (Addendum)
This note was copied from a baby's chart. Lactation Consultation Note  Patient Name: Paula Cross PHXTA'V Date: 02/28/2020   Infants are 94 hrs old. At this time, infants are being formula-fed with bottles. Maudry Diego, RN does not think Mom needs a lactation visit at this time. RN will let me know if that changes.  I gave Medela sanitizing spray to RN to provide to Mom to use after each use of breast pump once Mom is ready to pump (Mom has been receiving blood & plasma transfusions).    Lurline Hare [facility] 02/28/2020, 10:23 AM

## 2020-02-28 NOTE — Progress Notes (Signed)
Ampicillin due to be given at 0000. RN when to start medication through IV in right forearm and notices IV infiltrated. IV team called to start new IV STAT. Team arrived around 0030. New IV placed at 0100. Antibiotic started by RN right after IV placed. Dr. Cherly Hensen made aware of late start time.

## 2020-02-28 NOTE — Progress Notes (Signed)
While giving first unit of blood (315 mL) RN charted volume being administered at beginning and end time. When looking in blood transfusion records charting this twice makes it look like 650 mL of blood were received the one unit. RN unable to change this in chart. Only 315 mL were received from first unit of blood.

## 2020-02-29 DIAGNOSIS — O411230 Chorioamnionitis, third trimester, not applicable or unspecified: Secondary | ICD-10-CM

## 2020-02-29 DIAGNOSIS — D72819 Decreased white blood cell count, unspecified: Secondary | ICD-10-CM

## 2020-02-29 DIAGNOSIS — U071 COVID-19: Secondary | ICD-10-CM

## 2020-02-29 LAB — TYPE AND SCREEN
ABO/RH(D): B POS
Antibody Screen: NEGATIVE
Unit division: 0
Unit division: 0
Unit division: 0
Unit division: 0

## 2020-02-29 LAB — BPAM FFP
Blood Product Expiration Date: 202105262359
Blood Product Expiration Date: 202105262359
ISSUE DATE / TIME: 202105220210
ISSUE DATE / TIME: 202105221058
Unit Type and Rh: 1700
Unit Type and Rh: 7300

## 2020-02-29 LAB — BPAM RBC
Blood Product Expiration Date: 202106152359
Blood Product Expiration Date: 202106162359
Blood Product Expiration Date: 202106182359
Blood Product Expiration Date: 202106192359
ISSUE DATE / TIME: 202105212043
ISSUE DATE / TIME: 202105212340
ISSUE DATE / TIME: 202105220406
ISSUE DATE / TIME: 202105220632
Unit Type and Rh: 7300
Unit Type and Rh: 7300
Unit Type and Rh: 7300
Unit Type and Rh: 7300

## 2020-02-29 LAB — PREPARE FRESH FROZEN PLASMA
Unit division: 0
Unit division: 0

## 2020-02-29 LAB — BASIC METABOLIC PANEL
Anion gap: 10 (ref 5–15)
BUN: 7 mg/dL (ref 6–20)
CO2: 22 mmol/L (ref 22–32)
Calcium: 6.8 mg/dL — ABNORMAL LOW (ref 8.9–10.3)
Chloride: 106 mmol/L (ref 98–111)
Creatinine, Ser: 1.1 mg/dL — ABNORMAL HIGH (ref 0.44–1.00)
GFR calc Af Amer: >60 mL/min (ref >60)
GFR calc non Af Amer: >60 mL/min (ref >60)
Glucose, Bld: 75 mg/dL (ref 70–99)
Potassium: 3.5 mmol/L (ref 3.5–5.1)
Sodium: 138 mmol/L (ref 135–145)

## 2020-02-29 LAB — CBC WITH DIFFERENTIAL/PLATELET
Abs Immature Granulocytes: 0.03 10*3/uL (ref 0.00–0.07)
Basophils Absolute: 0 10*3/uL (ref 0.0–0.1)
Basophils Relative: 0 %
Eosinophils Absolute: 0 10*3/uL (ref 0.0–0.5)
Eosinophils Relative: 0 %
HCT: 32.6 % — ABNORMAL LOW (ref 36.0–46.0)
Hemoglobin: 10.9 g/dL — ABNORMAL LOW (ref 12.0–15.0)
Immature Granulocytes: 1 %
Lymphocytes Relative: 21 %
Lymphs Abs: 1.2 10*3/uL (ref 0.7–4.0)
MCH: 27 pg (ref 26.0–34.0)
MCHC: 33.4 g/dL (ref 30.0–36.0)
MCV: 80.7 fL (ref 80.0–100.0)
Monocytes Absolute: 0.2 10*3/uL (ref 0.1–1.0)
Monocytes Relative: 3 %
Neutro Abs: 4.3 10*3/uL (ref 1.7–7.7)
Neutrophils Relative %: 75 %
Platelets: UNDETERMINED 10*3/uL (ref 150–400)
RBC: 4.04 MIL/uL (ref 3.87–5.11)
RDW: 18.9 % — ABNORMAL HIGH (ref 11.5–15.5)
WBC: 5.7 10*3/uL (ref 4.0–10.5)
nRBC: 0 % (ref 0.0–0.2)

## 2020-02-29 NOTE — Progress Notes (Addendum)
ID PROGRESS NOTE   28yo F admitted for fevers, headache, malaise, abdominal pain, AKI found to have symptomatic covid plus pre-eclampsia in 3rd trimester of her twin-pregnancy. c-section findings c/w chorioamnionitis, c/b post-op blood loss, anemia, requiring 4 U RBC transfusion   Afebrile this morning; she says she only has slight right lower quadrant pain when she gets up to move to the bathroom She is worried that her babies are not feeding well.  On exam: A x O by 3 in NAD Wound vac in place +1 pitting edema  Repeat labs this morning show improvement in HGB and WBC, and Cr back to baseline  Recommendations:  Continue on current IV abtx for additional 24hrs until we can document afebrile x 24-36hr, then we can stop - considered she has received for treatment of chorioamnionitis, also since she has delivered her babies  Will decrease IVF to 46mL and also d/c the 1L bolus before gentamicin however she still will get bolus post gentamicin dose to minimize aki through tomorrow. Anticipate that if she continues to improve can stop IVF and will stop gent  Hypokalemia = improved now within normal range  covid-19 disease = mild disease thus far. She is roughly 12 days into her disease process from when she recalls onset of symptoms. No hypoxia.

## 2020-02-29 NOTE — Progress Notes (Signed)
POD #3 S/p C/S A/G/C  covid 19 positive Chorioamnionitis Morbid obesity on lovenox( prophylaxis)  S: moving around more. Feels better Express frustration regarding info on care of  Twins  O:  Patient Vitals for the past 24 hrs:  BP Temp Temp src Pulse Resp SpO2  02/29/20 1200 116/80 98.6 F (37 C) Oral 98 20 100 %  02/29/20 0904 102/80 98.9 F (37.2 C) Oral 100 (!) 22 100 %  02/29/20 0534 -- -- -- -- (!) 24 --  02/29/20 0414 (!) 115/97 99.7 F (37.6 C) Oral (!) 108 -- 97 %  02/29/20 0124 -- 99.8 F (37.7 C) Oral -- -- --  02/28/20 2342 128/88 100.1 F (37.8 C) Oral (!) 107 18 98 %  02/28/20 2051 113/68 99.9 F (37.7 C) Oral (!) 113 18 98 %  02/28/20 1530 110/70 99.1 F (37.3 C) Oral (!) 102 20 99 %  02/28/20 1310 129/83 (!) 101.5 F (38.6 C) Oral (!) 112 (!) 22 97 %  Morbidly obese female in NAD Lungs clear to A Breast pedunculated soft leaking milk Cor RRR abd obese soft nontender Wound vac in place Extr. 1+ edema No calf tenderness Pad scant lochia  IMP: Improving on A/C/G Anemia related to PPH improved with PRBC Morbid obesity Covid-19 positive POD # 3 s/p C/S Hypokalemia on K supplement P) cont antibiotics until afebrile x 24 hrs. Pt informed of plan. Encourage ambulation in room Wound vac for 7 days Labs ordered by Dr snider ID note appreciated

## 2020-02-29 NOTE — Lactation Note (Signed)
This note was copied from a baby's chart. Lactation Consultation Note  Patient Name: Paula Cross WNIOE'V Date: 02/29/2020   I called into patient's room to inquire if Mom would like a lactation visit today. Mom responded that she did not need a visit, as her RN had just helped her with her question.  Mom is pumping.   Stefanie, RN has spoken with parents about increasing infants' feeding volumes.     Lurline Hare [facility] 02/29/2020, 2:35 PM

## 2020-03-01 DIAGNOSIS — O411230 Chorioamnionitis, third trimester, not applicable or unspecified: Secondary | ICD-10-CM

## 2020-03-01 DIAGNOSIS — R7989 Other specified abnormal findings of blood chemistry: Secondary | ICD-10-CM

## 2020-03-01 DIAGNOSIS — U071 COVID-19: Secondary | ICD-10-CM

## 2020-03-01 LAB — CBC WITH DIFFERENTIAL/PLATELET
Abs Immature Granulocytes: 0.01 10*3/uL (ref 0.00–0.07)
Basophils Absolute: 0 10*3/uL (ref 0.0–0.1)
Basophils Relative: 0 %
Eosinophils Absolute: 0 10*3/uL (ref 0.0–0.5)
Eosinophils Relative: 1 %
HCT: 33.1 % — ABNORMAL LOW (ref 36.0–46.0)
Hemoglobin: 10.8 g/dL — ABNORMAL LOW (ref 12.0–15.0)
Immature Granulocytes: 0 %
Lymphocytes Relative: 22 %
Lymphs Abs: 1 10*3/uL (ref 0.7–4.0)
MCH: 27 pg (ref 26.0–34.0)
MCHC: 32.6 g/dL (ref 30.0–36.0)
MCV: 82.8 fL (ref 80.0–100.0)
Monocytes Absolute: 0.2 10*3/uL (ref 0.1–1.0)
Monocytes Relative: 5 %
Neutro Abs: 3.4 10*3/uL (ref 1.7–7.7)
Neutrophils Relative %: 72 %
Platelets: 262 10*3/uL (ref 150–400)
RBC: 4 MIL/uL (ref 3.87–5.11)
RDW: 19.3 % — ABNORMAL HIGH (ref 11.5–15.5)
WBC: 4.6 10*3/uL (ref 4.0–10.5)
nRBC: 0 % (ref 0.0–0.2)

## 2020-03-01 LAB — BASIC METABOLIC PANEL
Anion gap: 9 (ref 5–15)
BUN: 6 mg/dL (ref 6–20)
CO2: 23 mmol/L (ref 22–32)
Calcium: 6.4 mg/dL — CL (ref 8.9–10.3)
Chloride: 107 mmol/L (ref 98–111)
Creatinine, Ser: 1.19 mg/dL — ABNORMAL HIGH (ref 0.44–1.00)
GFR calc Af Amer: >60 mL/min (ref >60)
GFR calc non Af Amer: >60 mL/min (ref >60)
Glucose, Bld: 80 mg/dL (ref 70–99)
Potassium: 3.1 mmol/L — ABNORMAL LOW (ref 3.5–5.1)
Sodium: 139 mmol/L (ref 135–145)

## 2020-03-01 LAB — MAGNESIUM: Magnesium: 0.7 mg/dL — CL (ref 1.7–2.4)

## 2020-03-01 LAB — SURGICAL PATHOLOGY

## 2020-03-01 IMAGING — US US OB TRANSVAGINAL
1 series · 14 of 28 positions shown · non-contrast
Comparison: None.

CLINICAL DATA: Vaginal bleeding with positive pregnancy test

EXAM:
TWIN OBSTETRIC <14WK US AND TRANSVAGINAL OB US

[Series 1: us ob transvaginal · 14 of 33 slices shown]
[im 2/33]
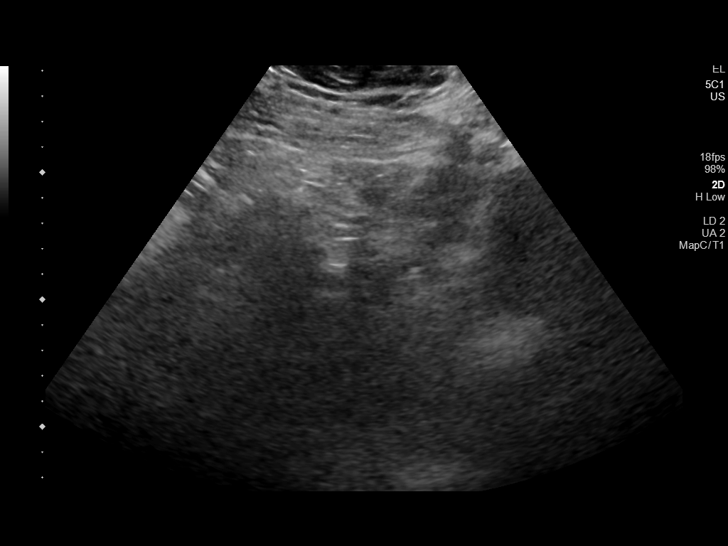
[im 4/33]
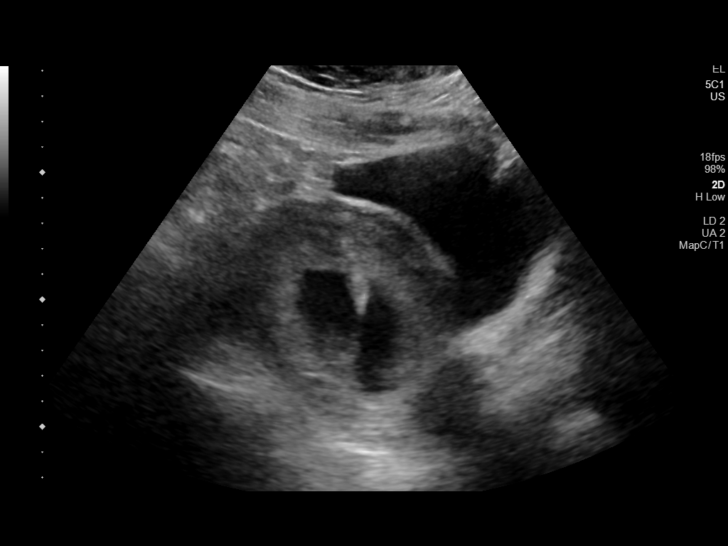
[im 6/33]
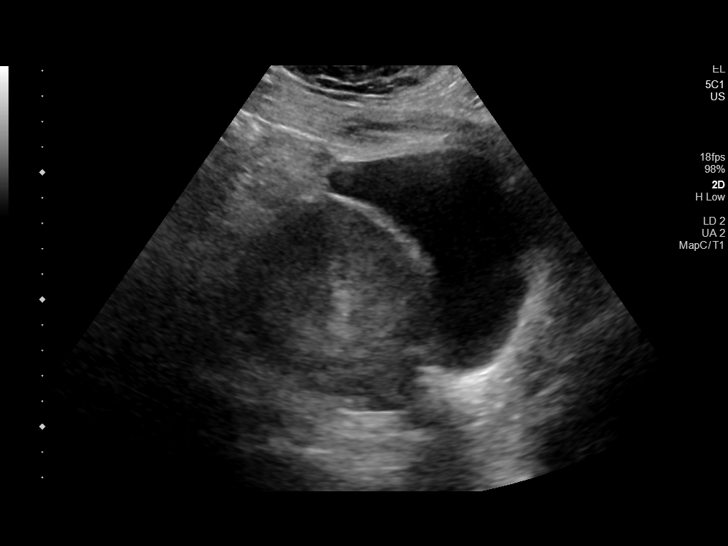
[im 9/33]
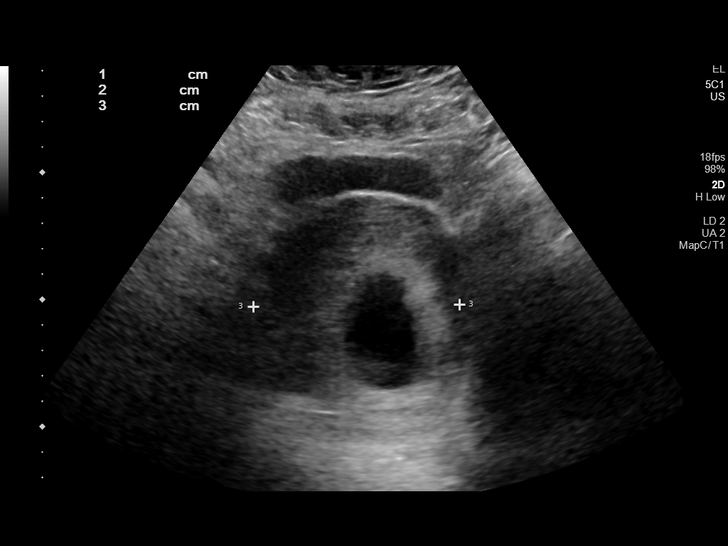
[im 11/33]
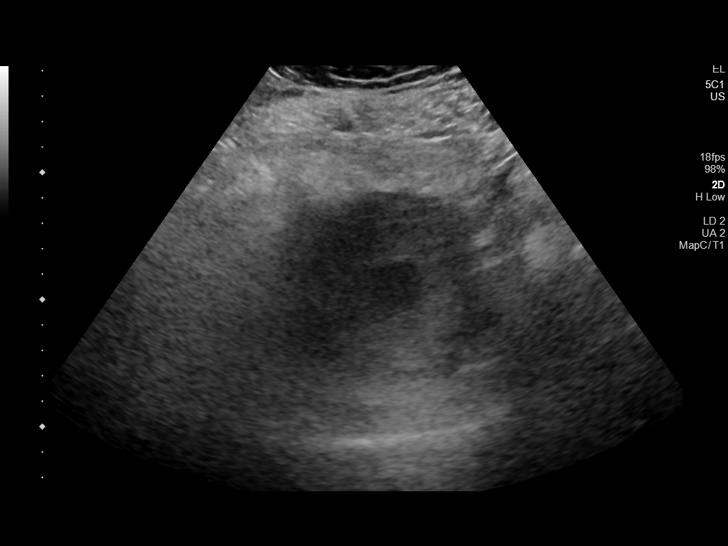
[im 14/33]
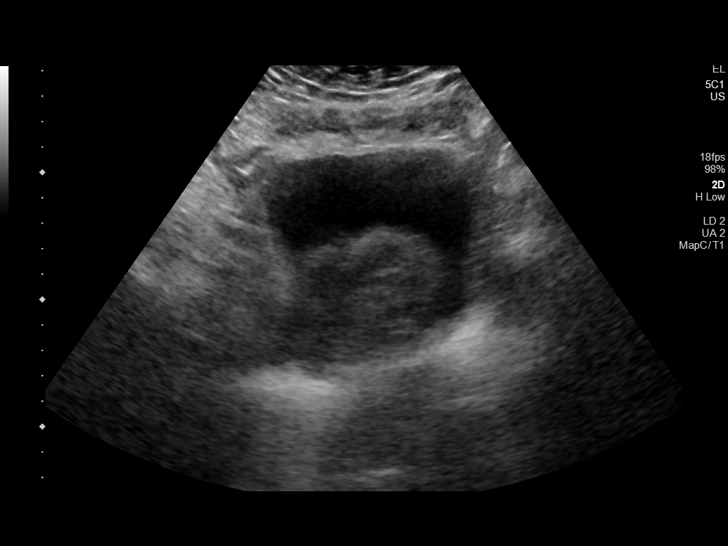
[im 16/33]
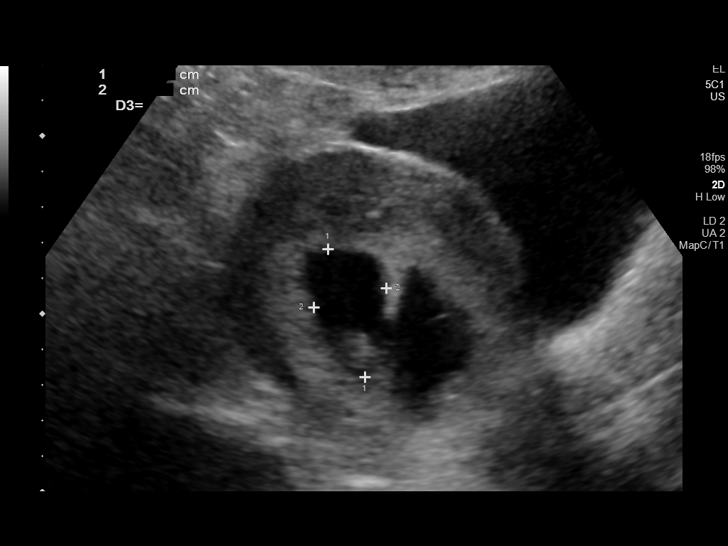
[im 18/33]
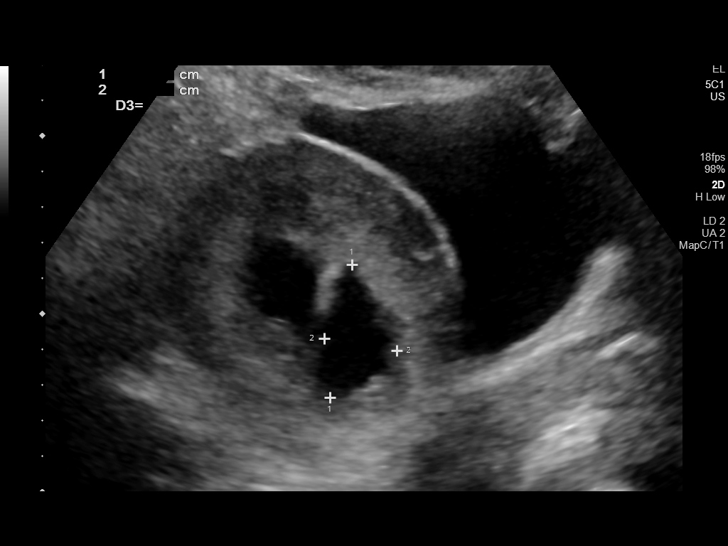
[im 21/33]
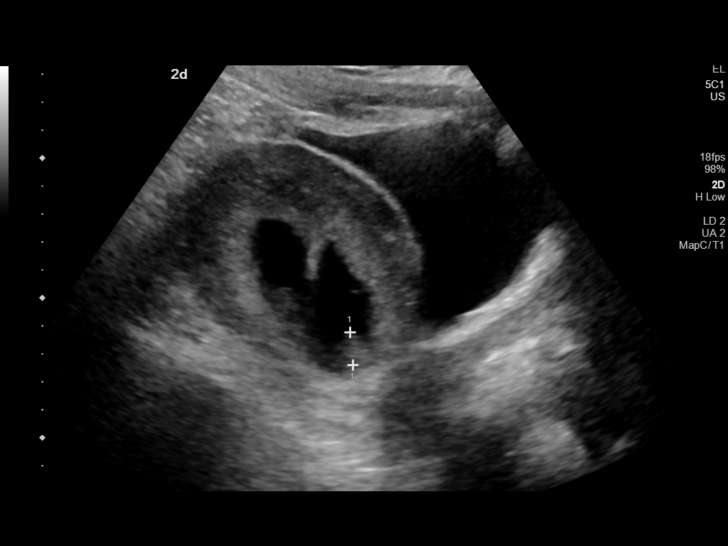
[im 23/33]
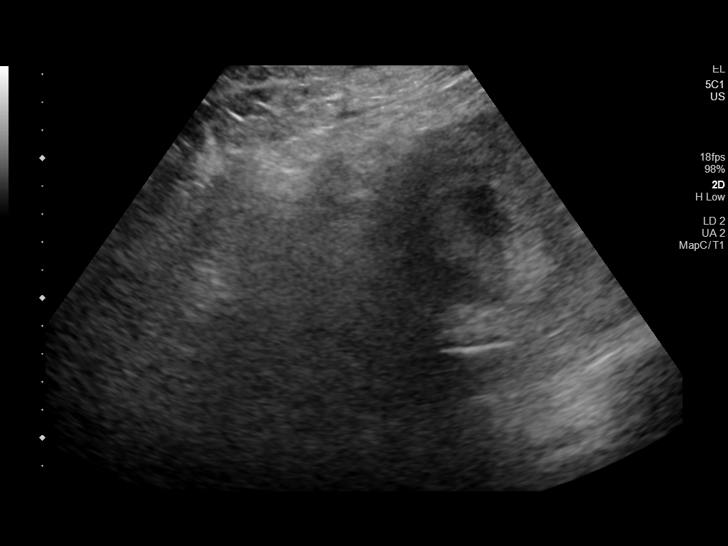
[im 25/33]
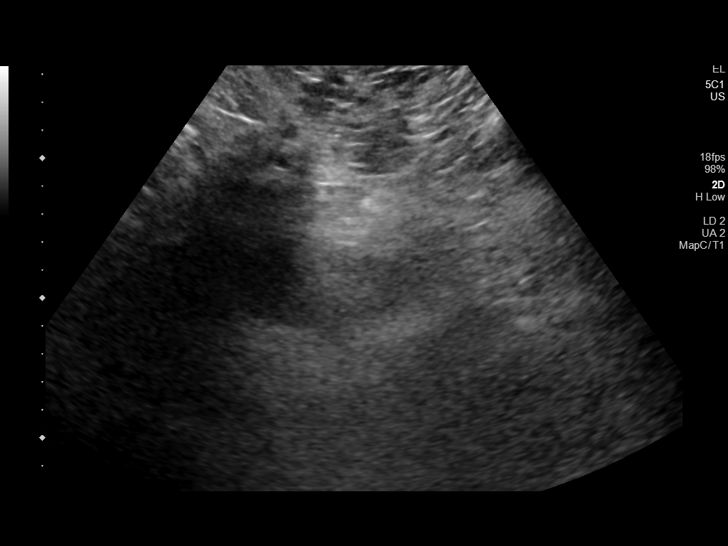
[im 28/33]
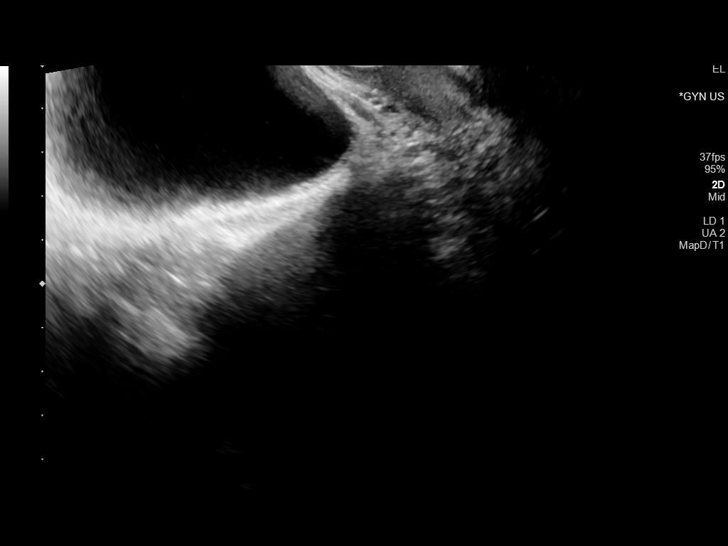
[im 30/33]
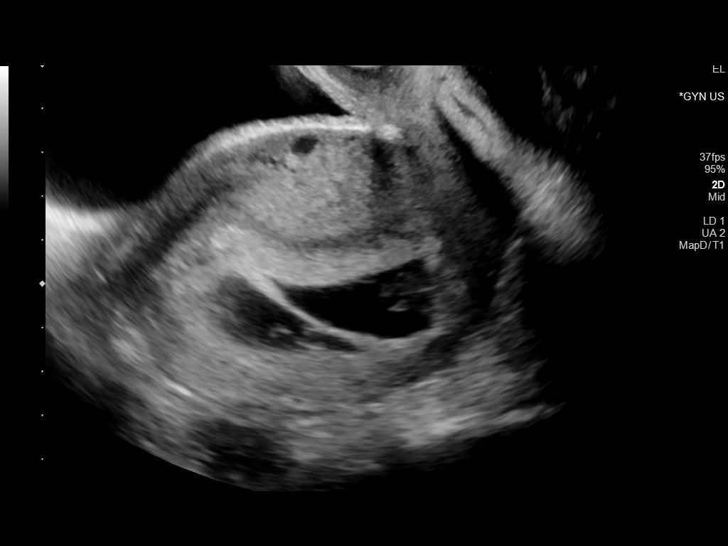
[im 33/33]
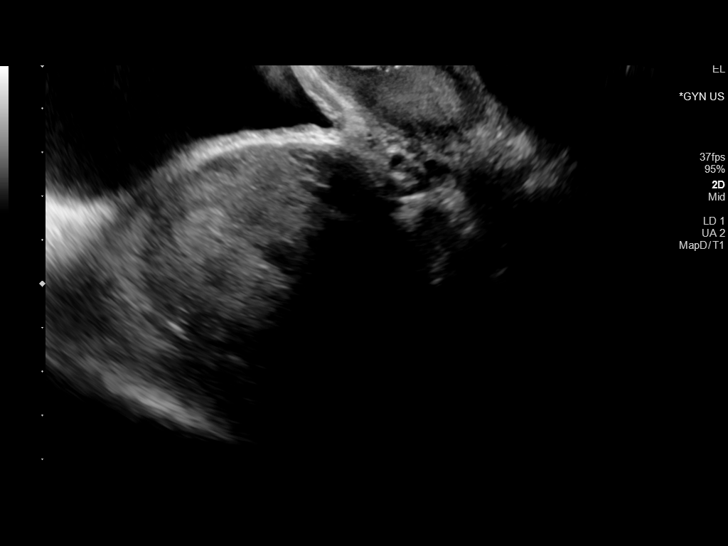

[14 of 28 positions shown; findings below may reference images not displayed]

FINDINGS: Number of IUPs:  2

TWIN 1

Yolk sac:  Present

Embryo:  Present

Cardiac Activity: Present

Heart Rate: 156 bpm

CRL:  22.9 mm   9 w 0 d

TWIN 2

Yolk sac:  Present

Embryo:  Present

Cardiac Activity: Present

Heart Rate: 149 bpm

CRL:  21 mm   8 w 5 d

Subchorionic hemorrhage:  Small subchorionic hemorrhage is noted.

Maternal uterus/adnexae: The ovaries are not well visualized. Mild
fluid is noted within the cervical canal.
IMPRESSION: Twin live intrauterine gestations.

Small subchorionic hemorrhage is noted. No other focal abnormality
is seen.

## 2020-03-01 IMAGING — US US OB COMP LESS 14 WK
1 series · 14 of 28 positions shown · non-contrast
Comparison: None.

CLINICAL DATA: Vaginal bleeding with positive pregnancy test

EXAM:
TWIN OBSTETRIC <14WK US AND TRANSVAGINAL OB US

[Series 1: us ob comp less 14 wk · 14 of 149 slices shown]
[im 6/149]
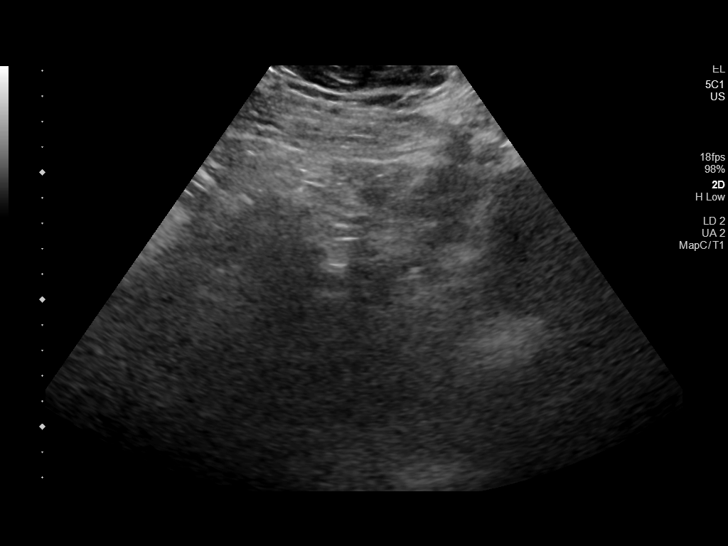
[im 17/149]
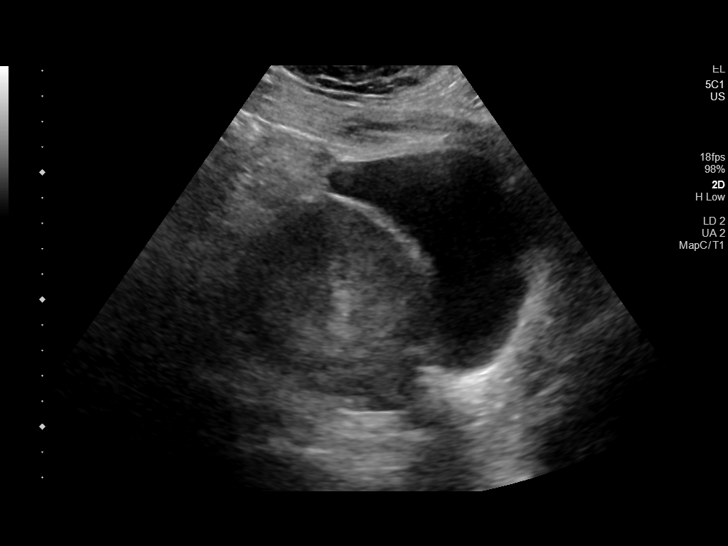
[im 28/149]
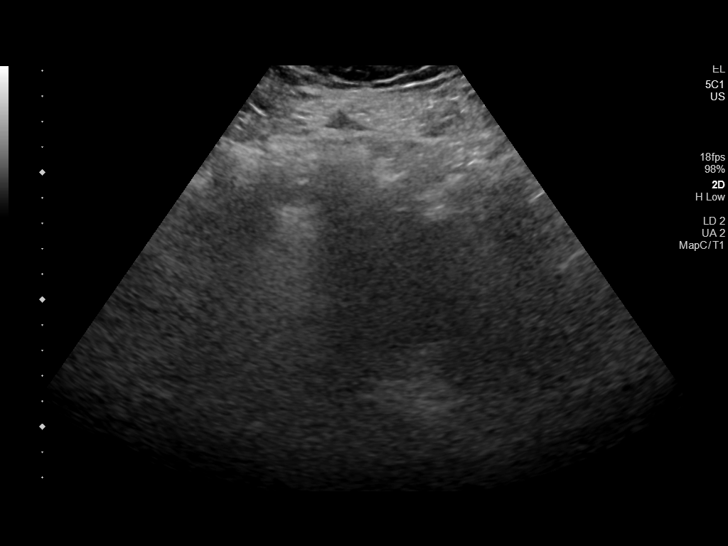
[im 39/149]
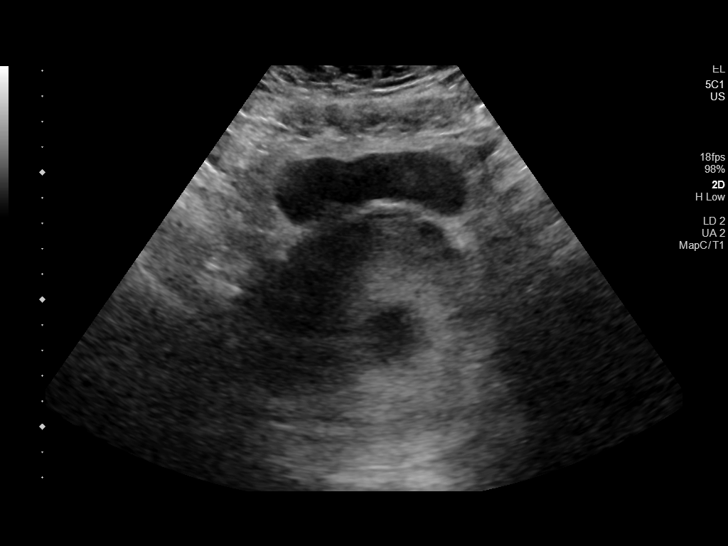
[im 50/149]
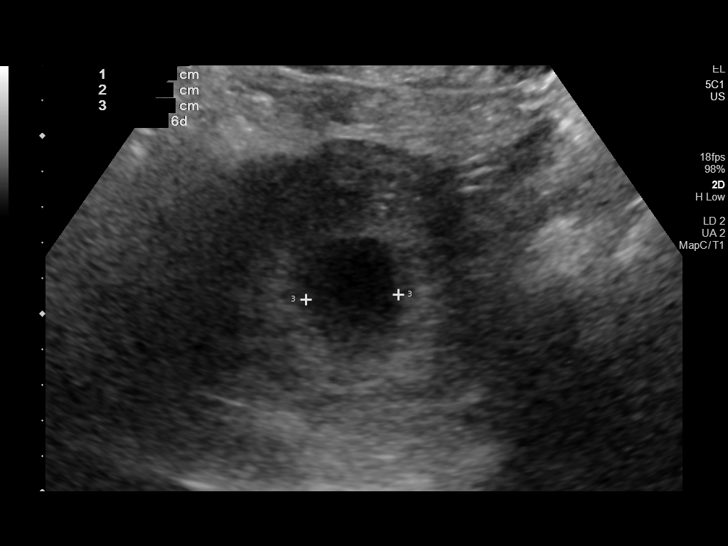
[im 61/149]
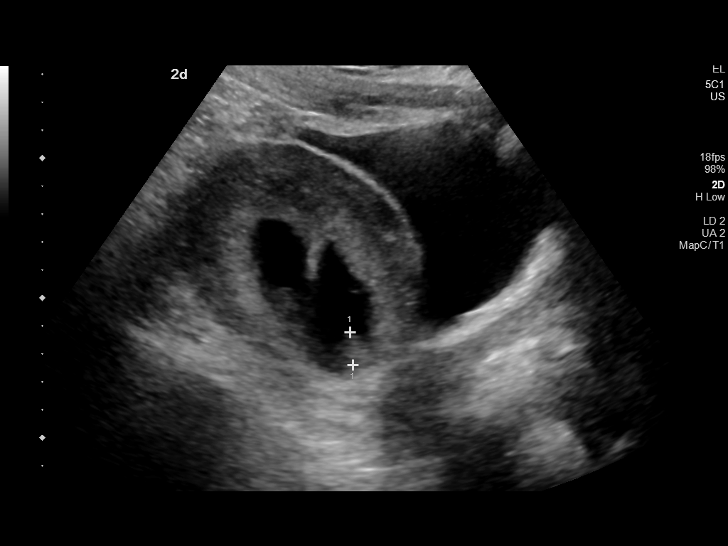
[im 72/149]
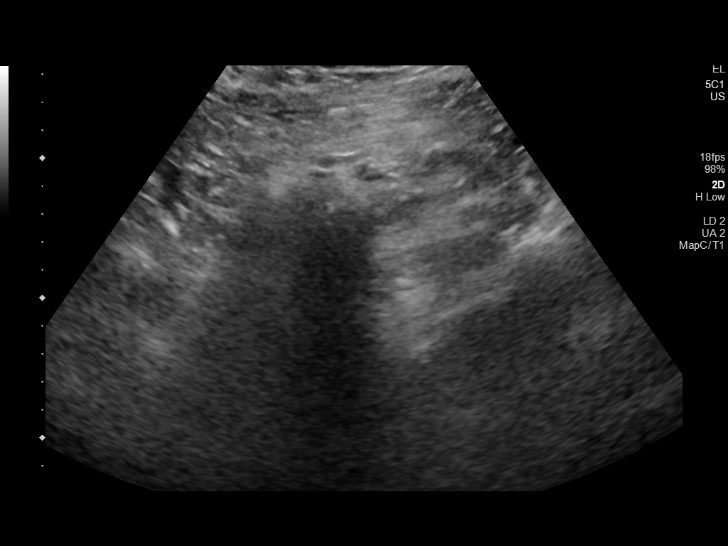
[im 83/149]
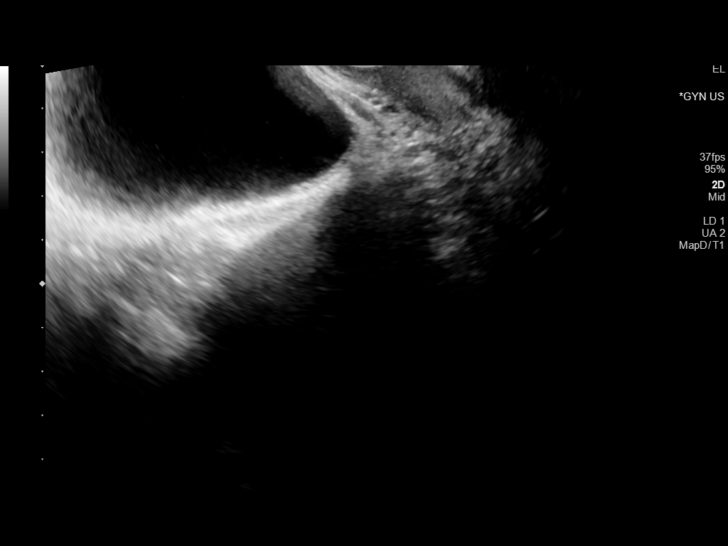
[im 94/149]
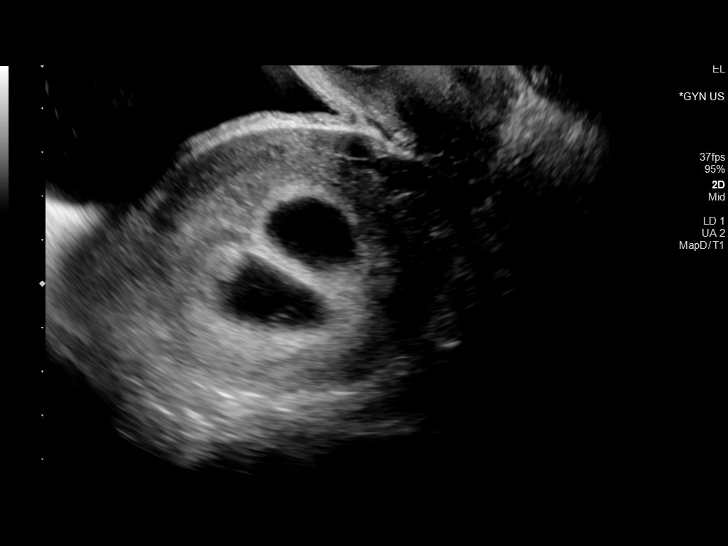
[im 105/149]
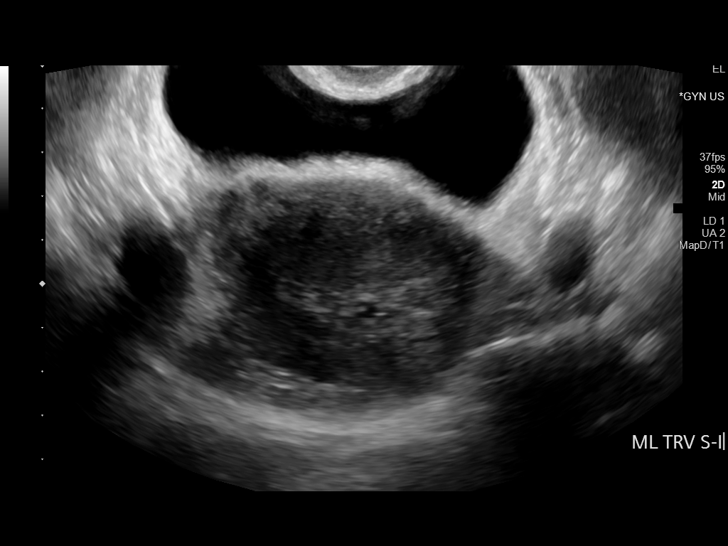
[im 116/149]
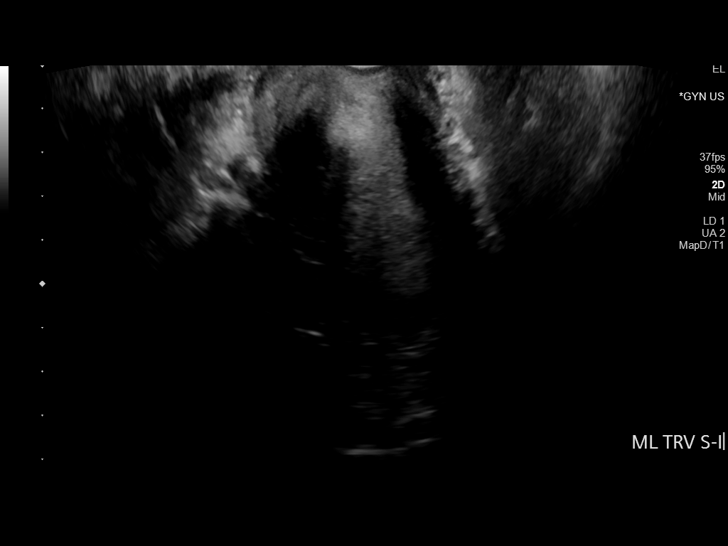
[im 127/149]
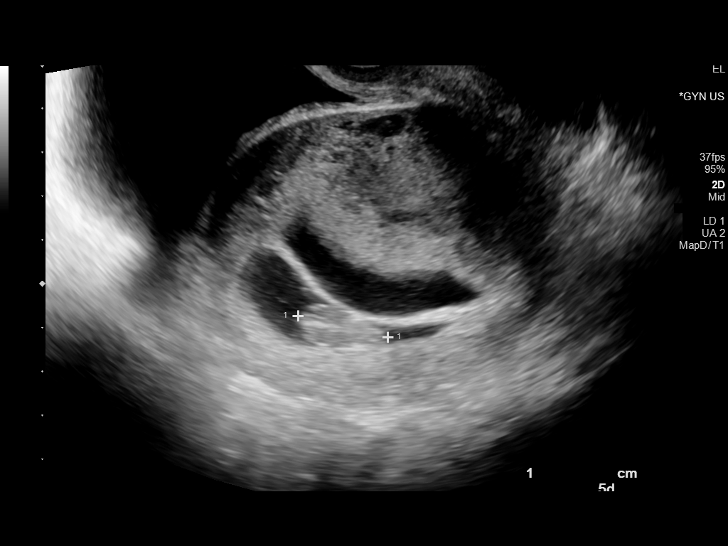
[im 138/149]
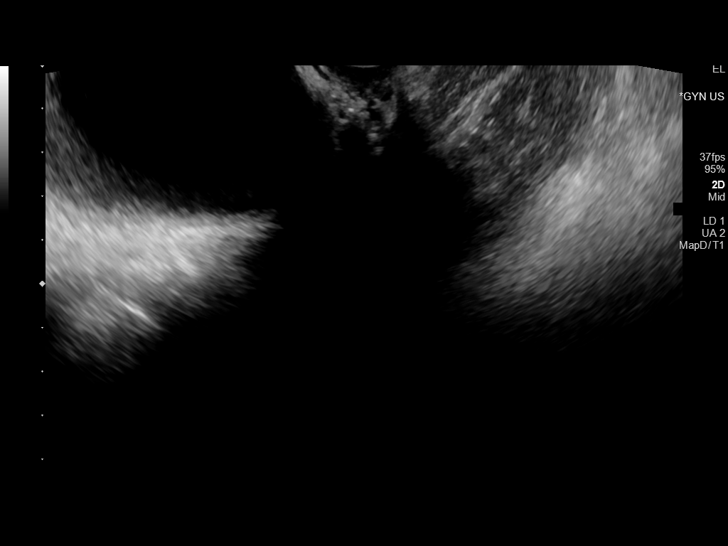
[im 149/149]
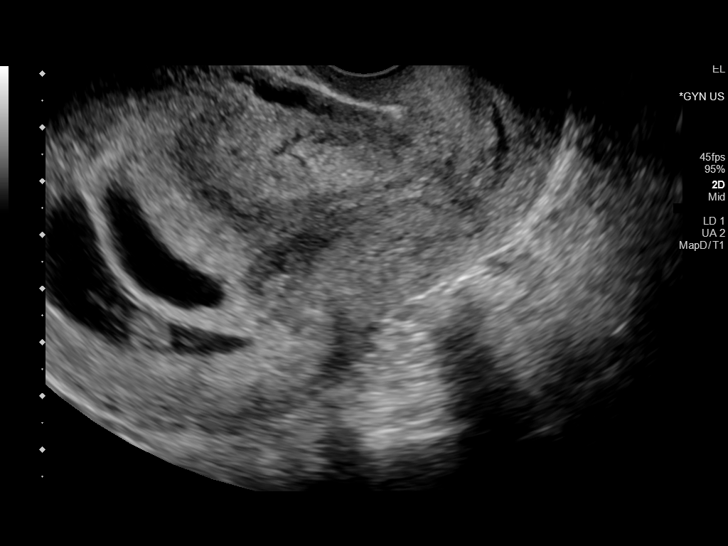

[14 of 28 positions shown; findings below may reference images not displayed]

FINDINGS: Number of IUPs:  2

TWIN 1

Yolk sac:  Present

Embryo:  Present

Cardiac Activity: Present

Heart Rate: 156 bpm

CRL:  22.9 mm   9 w 0 d

TWIN 2

Yolk sac:  Present

Embryo:  Present

Cardiac Activity: Present

Heart Rate: 149 bpm

CRL:  21 mm   8 w 5 d

Subchorionic hemorrhage:  Small subchorionic hemorrhage is noted.

Maternal uterus/adnexae: The ovaries are not well visualized. Mild
fluid is noted within the cervical canal.
IMPRESSION: Twin live intrauterine gestations.

Small subchorionic hemorrhage is noted. No other focal abnormality
is seen.

## 2020-03-01 MED ORDER — SODIUM CHLORIDE 0.9 % IV SOLN
3.0000 g | Freq: Four times a day (QID) | INTRAVENOUS | Status: AC
  Administered 2020-03-01 (×3): 3 g via INTRAVENOUS
  Filled 2020-03-01: qty 8
  Filled 2020-03-01 (×2): qty 3

## 2020-03-01 MED ORDER — MAGNESIUM SULFATE 2 GM/50ML IV SOLN
2.0000 g | Freq: Once | INTRAVENOUS | Status: AC
  Administered 2020-03-01: 2 g via INTRAVENOUS
  Filled 2020-03-01: qty 50

## 2020-03-01 MED ORDER — FERROUS SULFATE 325 (65 FE) MG PO TABS
325.0000 mg | ORAL_TABLET | Freq: Two times a day (BID) | ORAL | Status: DC
  Administered 2020-03-02: 325 mg via ORAL
  Filled 2020-03-01: qty 1

## 2020-03-01 MED ORDER — SODIUM CHLORIDE 0.9 % IV SOLN
510.0000 mg | Freq: Once | INTRAVENOUS | Status: AC
  Administered 2020-03-01: 510 mg via INTRAVENOUS
  Filled 2020-03-01: qty 17

## 2020-03-01 NOTE — Progress Notes (Signed)
Pt called out saying her IV was burning where the Magnesium was going in. RN entered room to assess situation. IV site WNL, not infiltrated, red, warm, or had other issues observed. Magnesium running at 60mL/hr at this time. RN asked MBU charge nurse Victorino Dike for advice at this time. She said medication can be lowered to a slower rate in needed for pt's tolerance, and a warm heat compress can be added to her arm to reduce soreness. RN did both things. Pt then verbalized that the burning is not that bad and she would prefer to just leave the rate higher to get the infusion done quicker. RN verified pt's request and adjusted rate accordingly, back to the 52ml/hr. RN left room with pt in stable condition, to return in about 40 minutes when medication is supposed to end. Pt with no other questions or concerns at this time.

## 2020-03-01 NOTE — Progress Notes (Addendum)
ID PROGRESS NOTE  She remains afebrile which is encouraging   Will narrow antibiotics to unasyn to finish at the end of the day;since no longer giving gent, and no longer has aki, does not need IVF supplementation. Will change rate to [facility]  Will check cbc to see that continues to improve,  Will give 2gm IV magnesium to replete stores since hypomag on this mornings labs, and may explain why it was difficult to replete her potassium Will check BMP to see if still needs potassium repletion   In terms of covid-19 and isolation -  Onset of symptoms unclear if it was covid vs pregnancy related. Would use 5/19 as day 1 of isolation. Thus, needs to stay home through 5/29 as part of discharge orders. Have family members where masks  Will sign off  Aram Beecham B. Drue Second MD MPH [facility] [phone]

## 2020-03-01 NOTE — Progress Notes (Signed)
CRITICAL VALUE ALERT  Critical Value:  Calcium 6.4  Date & Time Notied:  03/01/2020 0930 (taken by Vivi Martens, RN)  Provider Notified: Carlean Jews, CNM  Orders Received/Actions taken: RN called CNM at 1010 due to last ID note saying "will sign off". RN and CNM had long discussion about what to do, and CNM said she is unsure about plan of care now as well- she will call ID MD and figure out changes and what to do and notify RN. RN also asked that she call into pt's room and inform her of the new plan of care as the patient is very upset and frustrated about all of the changes.

## 2020-03-01 NOTE — Progress Notes (Signed)
CRITICAL VALUE ALERT  Critical Value:  Magnesium 0.7  Date & Time Notied:  03/01/2020 0805  Provider Notified: Carlean Jews, CNM then Merceda Elks, MD  Orders Received/Actions taken: RN called Carlean Jews at [phone] who suggested to page the ID MD on-call who ordered the lab as the [facility] practice did not put the orders in. RN then paged Merceda Elks, MD at [phone]. See new orders.

## 2020-03-01 NOTE — Progress Notes (Signed)
POSTOPERATIVE DAY # 4 S/P Primary LTCS, Chorioamnionitis, Arrest of dilatation, Kathi Der Twins, Severe PEC, COVID-19 pos, baby girl "Minette Brine" and baby boy "Grayce Sessions"   S:         Reports feeling frustrated; feels like there is something new with her plan of care every day. Ready to be discharged home.   Denies HA, visual changes, RUQ/epigastric pain              Tolerating po intake / no nausea / no vomiting / + flatus / + BM  Denies dizziness, SOB, or CP             Bleeding is light             Pain controlled with Motrin, Tylenol, Oxy IR             Up ad lib / ambulatory/ voiding QS  Newborn breast and formula feeding  / Circumcision - planned for boy prior to d/c   O:  VS: BP 122/74 (BP Location: Left Arm) Comment: pre Magnesium Sulfate administration  Pulse 98   Temp 98.9 F (37.2 C) (Oral)   Resp 18   Ht 5\' 11"  (1.803 m)   Wt (!) 158 kg   LMP 06/18/2019   SpO2 97%   Breastfeeding Unknown   BMI 48.58 kg/m  Patient Vitals for the past 24 hrs:  BP Temp Temp src Pulse Resp SpO2  03/01/20 1748 134/77 (!) 97.5 F (36.4 C) Oral 96 20 99 %  03/01/20 1335 128/86 97.8 F (36.6 C) Axillary 100 18 98 %  03/01/20 1202 122/74 -- -- 98 -- --  03/01/20 0910 119/73 98.9 F (37.2 C) Oral (!) 106 18 97 %  03/01/20 0439 126/75 98.6 F (37 C) Oral 99 17 100 %  03/01/20 0028 (!) 122/91 98.9 F (37.2 C) Oral 99 18 100 %  02/29/20 2054 117/82 98.5 F (36.9 C) Oral 91 19 100 %    LABS:               Recent Labs    02/29/20 1014 03/01/20 0818  WBC 5.7 4.6  HGB 10.9* 10.8*  PLT PLATELET CLUMPS NOTED ON SMEAR, UNABLE TO ESTIMATE 262               Bloodtype: --/--/B POS (05/19 1438)  Rubella: Immune (11/19 0000)                                             I&O: Intake/Output      05/23 0701 - 05/24 0700 05/24 0701 - 05/25 0700   P.O.     I.V. (mL/kg) 0 (0)    Blood     Other 0    IV Piggyback 1510.1    Total Intake(mL/kg) 1510.1 (9.6)    Urine (mL/kg/hr)     Total Output     Net  +1510.1                      Physical Exam:             Alert and Oriented X3  Lungs: Clear and unlabored  Heart: regular rate and rhythm / no murmurs  Abdomen: soft, non-tender, obese, mild gaseous distention             Fundus: firm, appropriate tenderness, U-2  Dressing: Prevena wound dressing c/d/i. No drainage in canister              Incision:  approximated with sutures / no erythema / no ecchymosis / no drainage  Perineum: intact  Lochia: scant on peripad  Extremities: +3 pitting LE edema, no calf pain or tenderness,   A/P:     POD # 4 S/P Primary LTCS            Severe Pre-eclampsia   - BPs stable, off medications   - No neural s/s   - Persistent elevated serum creatinine   - Repeat CBC and CMP in AM  Chorioamnionitis   - ID consult appreciated and will sign off today    - s/p Amp, Gent, Clinda with good response   - Afebrile >36hrs   - Per ID, recommends 3 doses of IV Unasyn, then d/c antibiotics   ABL Anemia    - s/p 4 units PRBCs, 2 units FFP   - Has been on oral FE BID, plan for IV Feraheme x 1 dose today, then resume oral FE  Hypomagnesemia and hypocalcemia    - Per ID, 2 gram Magnesium sulfate IVBP   - Repeat labs in AM  Hypokalemia   - Continue on Klor-con BID  COVID-19 positive   - currently asymptomatic, no hypoxia and no afebrile    - Per ID, continue isolation after d/c home until 5/29; have family member wear masks   Obesity   - Lovenox prophylaxis   Dependent edema    - Defer HCTZ for now per Dr. Ernestina Penna   Routine postoperative care              Extensive discussion with patient regarding plan of care after consulting Dr. Billy Coast, Dr. Margo Aye ([facility]), and Dr. Drue Second (ID)  Recommend remaining inpatient and plan for d/c home if stable in AM. Due to COVID-19 pos status, plan to d/c Prevena wound dressing tomorrow and f/u at [facility] next week for repeat BP check   Carlean Jews, MSN, CNM [facility]   Late entry note:  Dr. Ernestina Penna followed up with patient this afternoon, and recommend IV Iron x 1 dose.

## 2020-03-01 NOTE — Lactation Note (Signed)
This note was copied from a baby's chart. Lactation Consultation Note  Patient Name: Paula Cross WZLYT'S Date: 03/01/2020 Reason for consult: Follow-up assessment  P2 mother whose infant twins are now 39 days old.  The babies are LPTIs at 36+1 weeks with a CGA of36+5 weeks.  Mother has not been breast feeding or attempting to breast feed.  Spoke with RN and mother is pumping and bottle feeding only.  Mother has had very low hemoglobin levels and is also Covid +.  She has not been feeling well enough to pump until recently.  Mother was beginning to leak this morning and RN provided breast pads.  She will follow up with the pumping and call me if mother has a desire to see me or has any questions/concerns related to pumping.     Maternal Data    Feeding Feeding Type: Bottle Fed - Formula Nipple Type: Dr. Levert Feinstein Preemie  LATCH Score                   Interventions    Lactation Tools Discussed/Used     Consult Status Consult Status: Follow-up Date: 03/02/20 Follow-up type: In-patient    Eleanore Junio R Phoebe Marter 03/01/2020, 11:15 AM

## 2020-03-02 LAB — COMPREHENSIVE METABOLIC PANEL
ALT: 111 U/L — ABNORMAL HIGH (ref 0–44)
AST: 117 U/L — ABNORMAL HIGH (ref 15–41)
Albumin: 1.8 g/dL — ABNORMAL LOW (ref 3.5–5.0)
Alkaline Phosphatase: 109 U/L (ref 38–126)
Anion gap: 9 (ref 5–15)
BUN: <5 mg/dL — ABNORMAL LOW (ref 6–20)
CO2: 22 mmol/L (ref 22–32)
Calcium: 7.1 mg/dL — ABNORMAL LOW (ref 8.9–10.3)
Chloride: 111 mmol/L (ref 98–111)
Creatinine, Ser: 0.9 mg/dL (ref 0.44–1.00)
GFR calc Af Amer: >60 mL/min (ref >60)
GFR calc non Af Amer: >60 mL/min (ref >60)
Glucose, Bld: 55 mg/dL — ABNORMAL LOW (ref 70–99)
Potassium: 3.4 mmol/L — ABNORMAL LOW (ref 3.5–5.1)
Sodium: 142 mmol/L (ref 135–145)
Total Bilirubin: 0.6 mg/dL (ref 0.3–1.2)
Total Protein: 5 g/dL — ABNORMAL LOW (ref 6.5–8.1)

## 2020-03-02 LAB — CBC
HCT: 33.1 % — ABNORMAL LOW (ref 36.0–46.0)
Hemoglobin: 10.6 g/dL — ABNORMAL LOW (ref 12.0–15.0)
MCH: 26.5 pg (ref 26.0–34.0)
MCHC: 32 g/dL (ref 30.0–36.0)
MCV: 82.8 fL (ref 80.0–100.0)
Platelets: 285 10*3/uL (ref 150–400)
RBC: 4 MIL/uL (ref 3.87–5.11)
RDW: 19.5 % — ABNORMAL HIGH (ref 11.5–15.5)
WBC: 3.5 10*3/uL — ABNORMAL LOW (ref 4.0–10.5)
nRBC: 0 % (ref 0.0–0.2)

## 2020-03-02 LAB — MAGNESIUM: Magnesium: 1.2 mg/dL — ABNORMAL LOW (ref 1.7–2.4)

## 2020-03-02 MED ORDER — OXYCODONE HCL 10 MG PO TABS: 10.0000 mg | ORAL_TABLET | Freq: Four times a day (QID) | ORAL | 0 refills | Status: AC | PRN

## 2020-03-02 MED ORDER — IBUPROFEN 600 MG PO TABS
600.0000 mg | ORAL_TABLET | Freq: Four times a day (QID) | ORAL | 1 refills | Status: AC | PRN
Start: 2020-03-02 — End: ?

## 2020-03-02 NOTE — Discharge Summary (Signed)
OB Discharge Summary  Patient Name: Paula Cross DOB: 1991/03/10 MRN: [id number]  Date of admission: 02/25/2020 Delivering MD:    Tanganyika, Bowlds [[id number]]  MODY, Jomaira, Darr [[id number]]  MODY, VAISHALI    Date of discharge: 03/02/2020  Admitting diagnosis: Normal labor and delivery [O80] Delivery by emergency cesarean [O99.892] Intrauterine pregnancy: [redacted]w[redacted]d     Secondary diagnosis:Principal Problem:   Postpartum care following cesarean delivery 5/20 Active Problems:   Dichorionic diamniotic twin pregnancy in second trimester   Normal labor and delivery   Preeclampsia, severe, third trimester   Delivery by emergency cesarean   Chorioamnionitis in third trimester   COVID-19 virus infection   Elevated serum creatinine   Maternal fever during labor  Additional problems: As above, acute blood loss anemia     Discharge diagnosis: Preeclampsia (severe) and Chorioamnionitis, active COVID-19 virus infection, acute blood loss anemia, acute kidney injury                                                                     Post partum procedures:blood transfusion and IV antibiotics  Augmentation: AROM and Pitocin  Complications: Intrauterine Inflammation or infection (Chorioamniotis) and Hemorrhage>1032mL  Hospital course:  Induction of Labor With Cesarean Section   29 y.o. yo G1P0102 at [redacted]w[redacted]d was admitted to the hospital 02/25/2020 for induction of labor. Patient had a labor course significant for induction of labor for severe preeclampsia with evidence of headache and elevated creatinine.  Patient had arrest of descent at 6 cm. The patient went for cesarean section due to Arrest of Descent and Di-ditwin pregnancy. Delivery details are as follows: Membrane Rupture Time/Date:    Louie, [facility] [[id number]]  4:08 PM    Jamesha, [facility] [[id number]]  4:08 PM  ,   Alanee, Ting Girl Unice Bailey [[id number]]  02/25/2020     Mandi, Mattioli [[id number]]  02/25/2020    Delivery Method:   Khaila, Velarde [[id number]]  C-Section, Low Transverse    Mlissa, Tamayo [facility] [[id number]]  C-Section, Low Transverse   Details of operation can be found in separate operative Note.   Patient began developing fevers and tachycardia intrapartum.  Her blood pressure was never in the severe range and her headache did resolve over time.  Patient's Covid screen came back positive.  Protocol was changed at that time.  At the time of C-section malodor was noted on amniotic fluid of baby B and patient was started on amp gent and Clinda postpartum.  Patient never developed an elevated white blood cell count but her kidney creatinine did climb to 1.4.  Patient continued with maternal tachycardia and fevers up to 103.  Hemoglobin dropped to a low of 4.8.  Patient did receive 4 units of packed red blood cells.  Prior to discharge she received a dose of IV iron.  Patient did have a consult with infectious disease who decided on postop day 3 to change from amp gent and Clinda to 3 doses of Unasyn.  No treatment was given for the COVID-19 infection as the date of onset was unclear.  Given her obesity and surgery and immobility patient was given Lovenox in the postpartum course.  By postop day 4 patient had  been afebrile for greater than 24 hours had no further uterine tenderness, no headache, no scotomata, no right upper quadrant pain.  Patient reported minimal bleeding and was ambulating without dizziness having normal bowel movements and normal voids.  Patient denied diarrhea.  Patient had no chest pain or shortness of breath.  Patient notes incisional pain was controlled with oral pain meds and she was taking oxycodone at night.  Patient is breast-feeding with good milk production.    Patient is discharged home in stable condition on 03/02/20.      Newborn Data: Birth date:   Priyana, Mccarey [[id number]]  02/26/2020     Lisandra, Mathisen [facility] [[id number]]  02/26/2020   Birth time:   Laniyah, Rosenwald [facility] [[id number]]  1:17 AM    Mosetta [facility] [[id number]]  1:17 AM   Gender:   Shareese, Macha [[id number]]  Female    Teeghan, Hammer [facility] [[id number]]  Female   Living status:   Leanora, Murin [facility] [[id number]]  Living    Zissel, [facility] [[id number]]  Living   Apgars:   Danielys, Madry [facility] [[id number]]  8    Tanysha, Quant [facility] [[id number]]  8  ,   Maeli, Spacek [[id number]]  9    Anivea, [place] [facility] [[id number]]  8   Weight:   Emmilynn, Marut [[id number]]  2920 g    Accalia, Rigdon [facility] [[id number]]  2925 g                                  Physical exam  Vitals:   03/01/20 1748 03/01/20 2108 03/02/20 0111 03/02/20 0555  BP: 134/77 137/81 137/89 (!) 129/93  Pulse: 96 93 100 (!) 107  Resp: 20 18 18 18   Temp: (!) 97.5 F (36.4 C) 97.8 F (36.6 C) 97.7 F (36.5 C) 97.6 F (36.4 C)  TempSrc: Oral Oral Oral Oral  SpO2: 99% 99% 100% 100%  Weight:      Height:       General: alert, cooperative and no distress Lochia: appropriate Uterine Fundus: firm Incision: Healing well with no significant drainage DVT Evaluation: No evidence of DVT seen on physical exam. Labs: Lab Results  Component Value Date   WBC 3.5 (L) 03/02/2020   HGB 10.6 (L) 03/02/2020   HCT 33.1 (L) 03/02/2020   MCV 82.8 03/02/2020   PLT 285 03/02/2020   CMP Latest Ref Rng & Units 03/02/2020  Glucose 70 - 99 mg/dL 03/04/2020)  BUN 6 - 20 mg/dL 24(M)  Creatinine <3(N - 1.00 mg/dL 3.61  Sodium 4.43 - 154 mmol/L 142  Potassium 3.5 - 5.1 mmol/L 3.4(L)  Chloride 98 - 111 mmol/L 111  CO2 22 - 32 mmol/L 22  Calcium 8.9 - 10.3 mg/dL 7.1(L)  Total Protein 6.5 - 8.1 g/dL 5.0(L)  Total Bilirubin 0.3 - 1.2 mg/dL 0.6  Alkaline Phos 38 - 126 U/L 109  AST 15 - 41 U/L 117(H)  ALT 0 - 44 U/L 111(H)    Discharge instruction: per After Visit Summary and  "Baby and Me Booklet".  After Visit Meds:  Allergies as of 03/02/2020   No Known Allergies     Medication List    TAKE these medications   acetaminophen 325 MG tablet Commonly known as: TYLENOL Take 2 tablets (650 mg total) by mouth every 4 (four) hours as needed (for pain scale < 4  OR  temperature  >/=  100.5 F).  calcium carbonate 500 MG chewable tablet Commonly known as: TUMS - dosed in mg elemental calcium Chew 2 tablets (400 mg of elemental calcium total) by mouth every 4 (four) hours as needed for indigestion.   docusate sodium 100 MG capsule Commonly known as: COLACE Take 100 mg by mouth daily.   famotidine 10 MG tablet Commonly known as: PEPCID Take 10 mg by mouth 2 (two) times daily.   ibuprofen 600 MG tablet Commonly known as: ADVIL Take 1 tablet (600 mg total) by mouth every 6 (six) hours as needed for mild pain.   IRON PO Take 325 mg by mouth daily.   Oxycodone HCl 10 MG Tabs Take 1 tablet (10 mg total) by mouth every 6 (six) hours as needed for up to 5 days for moderate pain.   prenatal multivitamin Tabs tablet Take 1 tablet by mouth daily at 12 noon.       Diet: routine diet  Activity: Advance as tolerated. Pelvic rest for 6 weeks.   Outpatient follow up:1 week Follow up Appt:No future appointments. Follow up visit: No follow-ups on file.  Postpartum contraception: Not Discussed  Newborn Data:   Pernie, Grosso [[id number]]  Live born female  Birth Weight: 6 lb 7 oz (2920 g) APGAR: 65, 9  Newborn Delivery   Birth date/time: 02/26/2020 01:17:00 Delivery type: C-Section, Low Transverse Trial of labor: Yes C-section categorization: Primary       Ysabella, Babiarz [[id number]]  Live born female  Birth Weight: 6 lb 7.2 oz (2925 g) APGAR: 47, 8  Newborn Delivery   Birth date/time: 02/26/2020 01:17:00 Delivery type: C-Section, Low Transverse Trial of labor: Yes C-section categorization: Primary      Baby Feeding:  Breast Disposition:home with mother   03/02/2020 Ala Dach, MD

## 2020-03-03 LAB — CULTURE, BLOOD (ROUTINE X 2)
Culture: NO GROWTH
Culture: NO GROWTH

## 2020-06-27 IMAGING — US US MFM OB LIMITED
1 series · 15 of 22 positions shown · non-contrast
Comparison: none

[Series 1: us mfm ob limited · 22 acquisitions, 15 frames shown]
[im 1/22]
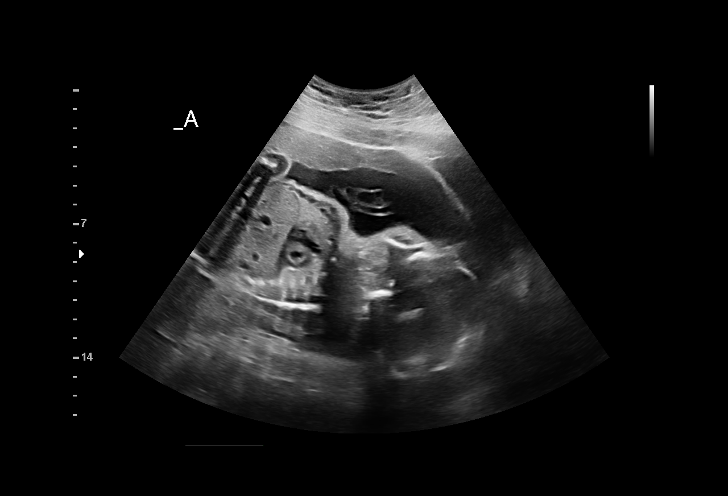
[im 3/22]
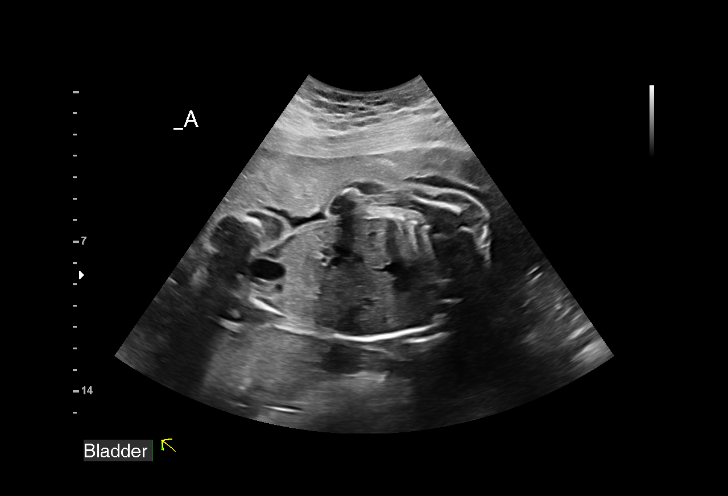
[im 4/22]
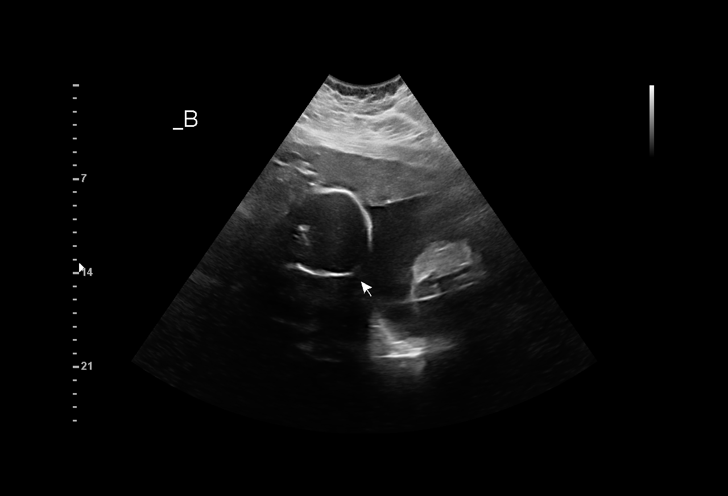
[im 6/22]
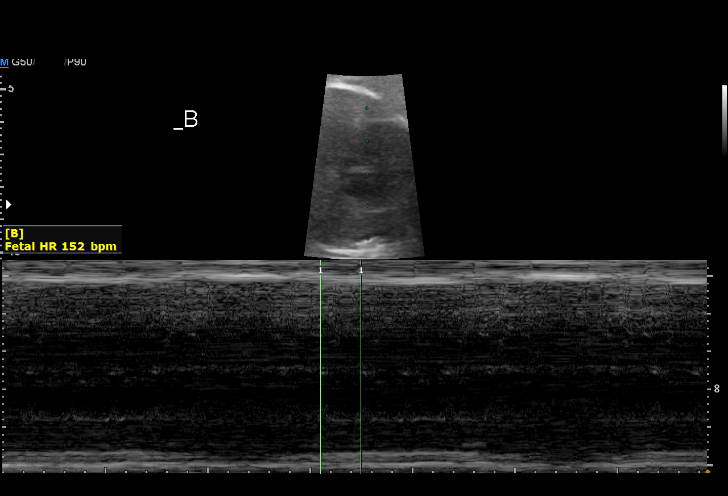
[im 7/22]
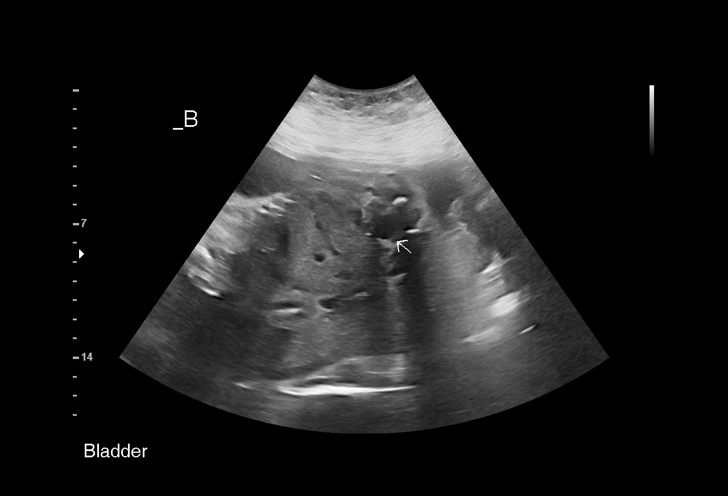
[im 9/22]
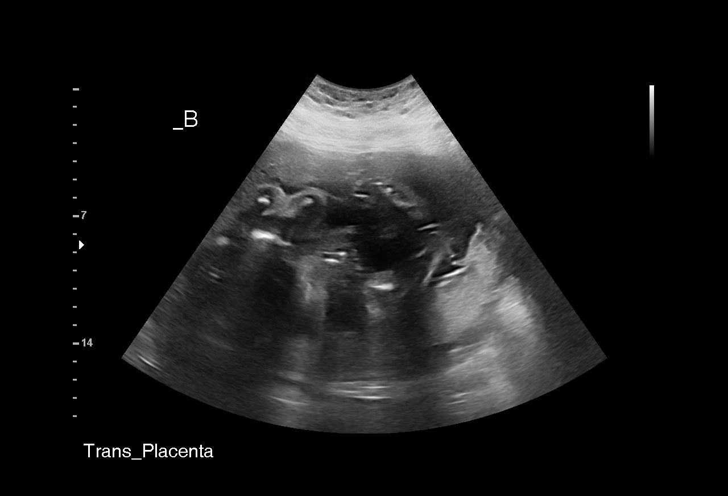
[im 10/22]
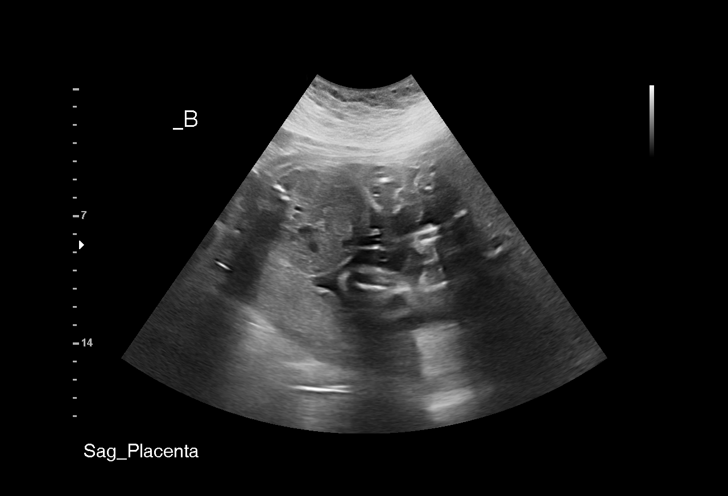
[im 12/22]
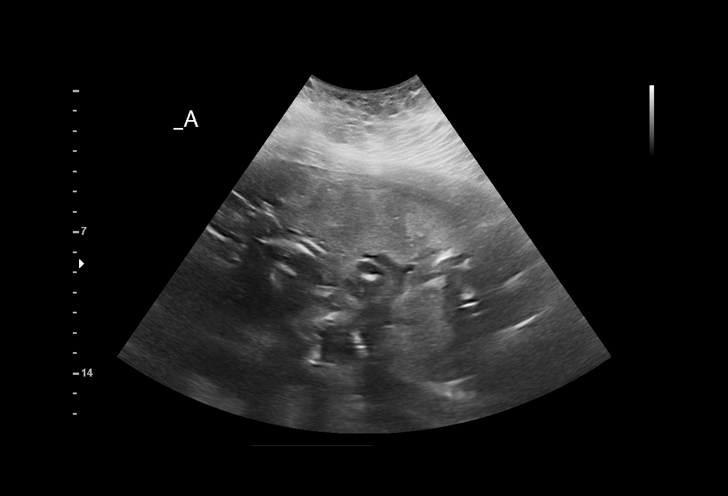
[im 13/22]
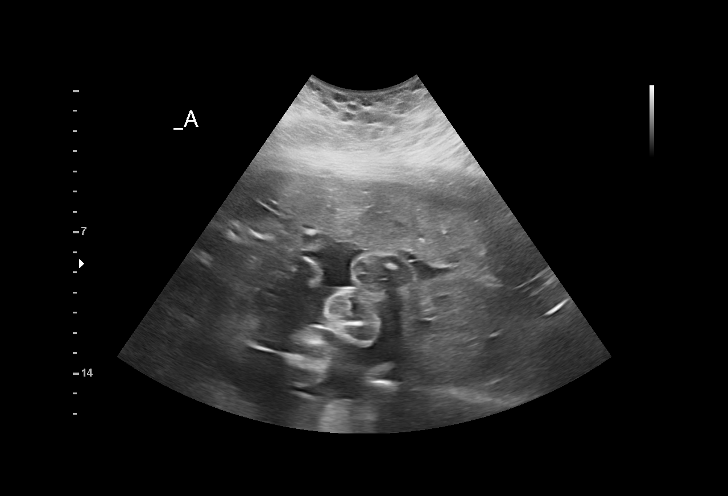
[im 14/22]
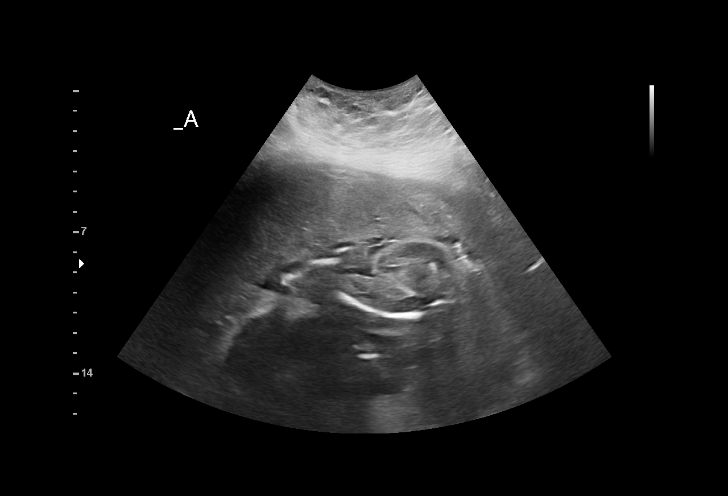
[im 16/22]
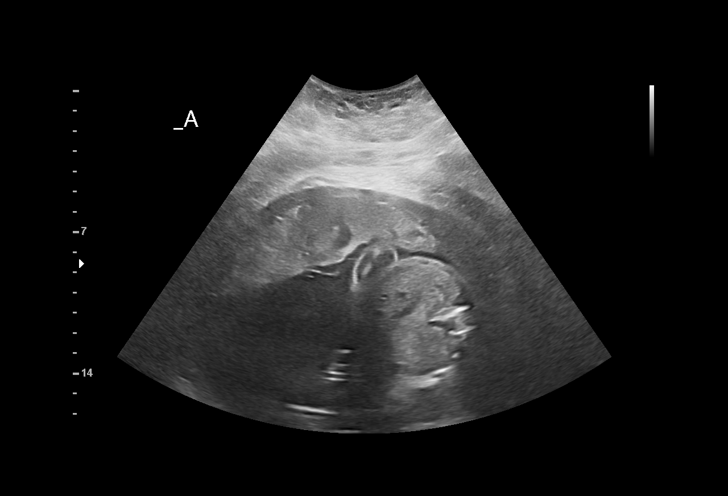
[im 17/22]
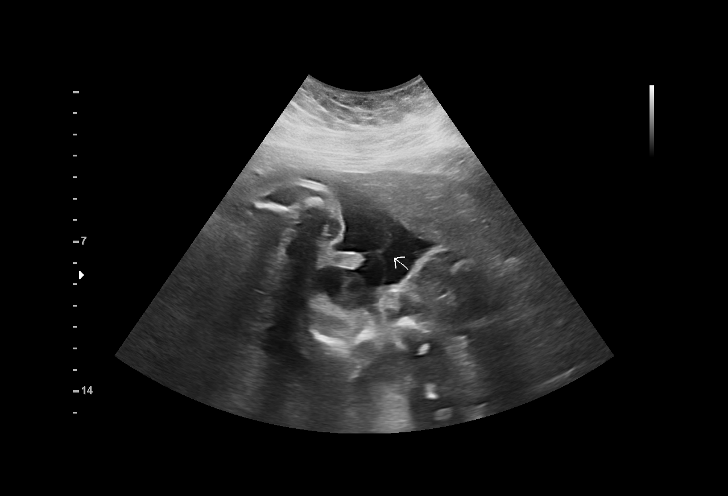
[im 19/22]
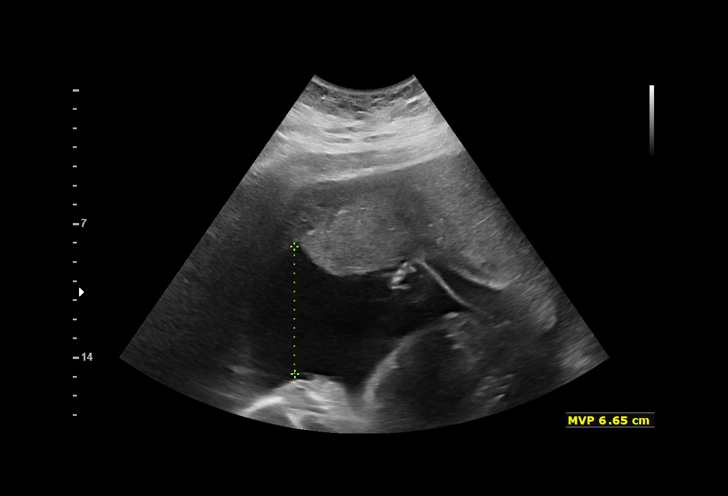
[im 20/22]
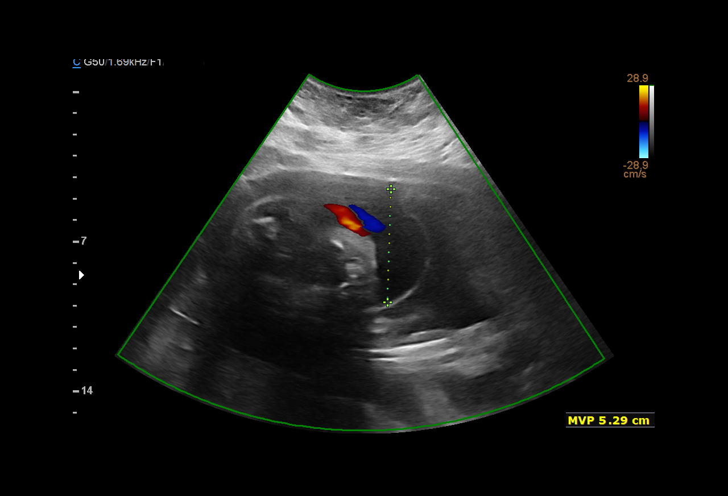
[im 22/22]
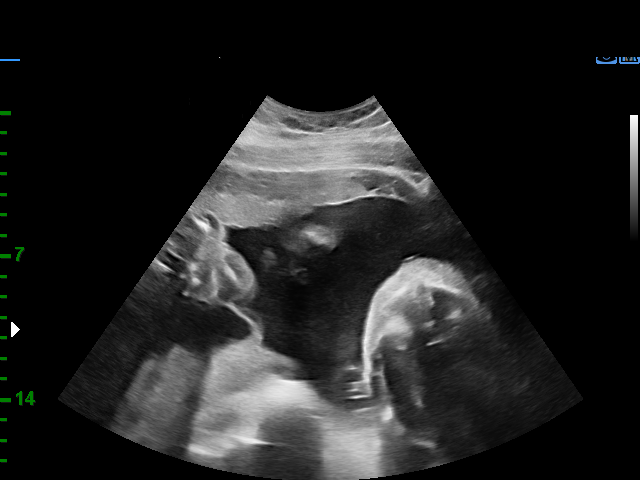

[15 of 22 positions shown; findings below may reference images not displayed]

[id number] [HOSPITAL]

  1  US MFM OB LIMITED                    76815.01     SUKAN CHETRI
 ----------------------------------------------------------------------

 ----------------------------------------------------------------------
Indications

  Cervical incompetence, second trimester
  Twin pregnancy, di/di, second trimester
  26 weeks gestation of pregnancy
  Maternal morbid obesity (bmi=43.4)
 ----------------------------------------------------------------------
Fetal Evaluation (Fetus A)

 Num Of Fetuses:         2
 Fetal Heart Rate(bpm):  133
 Cardiac Activity:       Observed
 Fetal Lie:              Lower Fetus
 Presentation:           Cephalic
 Placenta:               Anterior
 Membrane Desc:      Dividing Membrane seen - Dichorionic.

 Amniotic Fluid
 AFI FV:      Within normal limits

                             Largest Pocket(cm)

OB History

 Gravidity:    1
Gestational Age (Fetus A)

 LMP:           26w 0d        Date:  06/18/19                 EDD:   03/24/20
 Best:          26w 0d     Det. By:  LMP  (06/18/19)          EDD:   03/24/20
Fetal Evaluation (Fetus B)
 Num Of Fetuses:         2
 Fetal Heart Rate(bpm):  152
 Cardiac Activity:       Observed
 Fetal Lie:              Upper Fetus
 Presentation:           Transverse, head to maternal right
 Placenta:               Posterior
 Membrane Desc:      Dividing Membrane seen - Dichorionic.

 Amniotic Fluid
 AFI FV:      Within normal limits

                             Largest Pocket(cm)

Gestational Age (Fetus B)

 LMP:           26w 0d        Date:  06/18/19                 EDD:   03/24/20
 Best:          26w 0d     Det. By:  LMP  (06/18/19)          EDD:   03/24/20
Cervix Uterus Adnexa

 Cervix
 Not visualized (advanced GA >04wks)
Comments

 A limited ultrasound performed today shows that twin A is in
 the vertex presentation and twin B is transverse.

 There was normal amniotic fluid noted around both twin A
 and twin B.
